# Patient Record
Sex: Male | Born: 1965 | Race: Black or African American | Hispanic: No | Marital: Married | Smoking: Never smoker
Health system: Southern US, Community
[De-identification: ages and names within clinical notes are randomized; demographics above are authoritative.]

## PROBLEM LIST (undated history)

## (undated) DIAGNOSIS — M1990 Unspecified osteoarthritis, unspecified site: Secondary | ICD-10-CM

## (undated) DIAGNOSIS — Z8042 Family history of malignant neoplasm of prostate: Secondary | ICD-10-CM

## (undated) DIAGNOSIS — T7840XA Allergy, unspecified, initial encounter: Secondary | ICD-10-CM

## (undated) DIAGNOSIS — M2550 Pain in unspecified joint: Secondary | ICD-10-CM

## (undated) DIAGNOSIS — Z973 Presence of spectacles and contact lenses: Secondary | ICD-10-CM

## (undated) DIAGNOSIS — E785 Hyperlipidemia, unspecified: Secondary | ICD-10-CM

## (undated) DIAGNOSIS — N529 Male erectile dysfunction, unspecified: Secondary | ICD-10-CM

## (undated) DIAGNOSIS — K219 Gastro-esophageal reflux disease without esophagitis: Secondary | ICD-10-CM

## (undated) DIAGNOSIS — IMO0001 Reserved for inherently not codable concepts without codable children: Secondary | ICD-10-CM

## (undated) DIAGNOSIS — N400 Enlarged prostate without lower urinary tract symptoms: Secondary | ICD-10-CM

## (undated) HISTORY — DX: Reserved for inherently not codable concepts without codable children: IMO0001

## (undated) HISTORY — PX: HAND SURGERY: SHX662

## (undated) HISTORY — DX: Family history of malignant neoplasm of prostate: Z80.42

## (undated) HISTORY — DX: Allergy, unspecified, initial encounter: T78.40XA

## (undated) HISTORY — DX: Unspecified osteoarthritis, unspecified site: M19.90

## (undated) HISTORY — DX: Pain in unspecified joint: M25.50

## (undated) HISTORY — DX: Presence of spectacles and contact lenses: Z97.3

## (undated) HISTORY — DX: Gastro-esophageal reflux disease without esophagitis: K21.9

## (undated) HISTORY — DX: Male erectile dysfunction, unspecified: N52.9

## (undated) HISTORY — DX: Hyperlipidemia, unspecified: E78.5

## (undated) HISTORY — DX: Benign prostatic hyperplasia without lower urinary tract symptoms: N40.0

---

## 2004-06-27 ENCOUNTER — Inpatient Hospital Stay (HOSPITAL_COMMUNITY): Admission: AC | Admit: 2004-06-27 | Discharge: 2004-06-29 | Payer: Self-pay

## 2004-10-24 ENCOUNTER — Emergency Department (HOSPITAL_COMMUNITY): Admission: EM | Admit: 2004-10-24 | Discharge: 2004-10-24 | Payer: Self-pay | Admitting: Family Medicine

## 2004-10-27 ENCOUNTER — Emergency Department (HOSPITAL_COMMUNITY): Admission: EM | Admit: 2004-10-27 | Discharge: 2004-10-27 | Payer: Self-pay | Admitting: Family Medicine

## 2004-10-28 ENCOUNTER — Emergency Department (HOSPITAL_COMMUNITY): Admission: EM | Admit: 2004-10-28 | Discharge: 2004-10-28 | Payer: Self-pay | Admitting: Emergency Medicine

## 2013-08-16 DIAGNOSIS — IMO0001 Reserved for inherently not codable concepts without codable children: Secondary | ICD-10-CM

## 2013-08-16 HISTORY — DX: Reserved for inherently not codable concepts without codable children: IMO0001

## 2013-11-27 ENCOUNTER — Encounter: Payer: Self-pay | Admitting: Medical

## 2013-11-27 ENCOUNTER — Ambulatory Visit (INDEPENDENT_AMBULATORY_CARE_PROVIDER_SITE_OTHER): Payer: 59 | Admitting: Medical

## 2013-11-27 VITALS — BP 120/80 | HR 58 | Temp 98.0°F | Resp 16 | Ht 73.0 in | Wt 221.0 lb

## 2013-11-27 DIAGNOSIS — R350 Frequency of micturition: Secondary | ICD-10-CM

## 2013-11-27 DIAGNOSIS — Z0000 Encounter for general adult medical examination without abnormal findings: Secondary | ICD-10-CM

## 2013-11-27 DIAGNOSIS — Z8639 Personal history of other endocrine, nutritional and metabolic disease: Secondary | ICD-10-CM

## 2013-11-27 DIAGNOSIS — Z862 Personal history of diseases of the blood and blood-forming organs and certain disorders involving the immune mechanism: Secondary | ICD-10-CM

## 2013-11-27 DIAGNOSIS — N529 Male erectile dysfunction, unspecified: Secondary | ICD-10-CM

## 2013-11-27 LAB — LIPID PANEL
Cholesterol: 210 mg/dL — ABNORMAL HIGH (ref 0–200)
HDL: 47 mg/dL (ref >39)
LDL Cholesterol: 150 mg/dL — ABNORMAL HIGH (ref 0–99)
Total CHOL/HDL Ratio: 4.5 Ratio
Triglycerides: 63 mg/dL (ref <150)
VLDL: 13 mg/dL (ref 0–40)

## 2013-11-27 LAB — CBC
HCT: 42.3 % (ref 39.0–52.0)
Hemoglobin: 14.5 g/dL (ref 13.0–17.0)
MCH: 27.6 pg (ref 26.0–34.0)
MCHC: 34.3 g/dL (ref 30.0–36.0)
MCV: 80.6 fL (ref 78.0–100.0)
Platelets: 221 10*3/uL (ref 150–400)
RBC: 5.25 MIL/uL (ref 4.22–5.81)
RDW: 15.1 % (ref 11.5–15.5)
WBC: 4.7 10*3/uL (ref 4.0–10.5)

## 2013-11-27 LAB — COMPREHENSIVE METABOLIC PANEL
ALT: 32 U/L (ref 0–53)
AST: 24 U/L (ref 0–37)
Albumin: 4.3 g/dL (ref 3.5–5.2)
Alkaline Phosphatase: 73 U/L (ref 39–117)
BUN: 11 mg/dL (ref 6–23)
CO2: 26 mEq/L (ref 19–32)
Calcium: 9.4 mg/dL (ref 8.4–10.5)
Chloride: 103 mEq/L (ref 96–112)
Creat: 1.03 mg/dL (ref 0.50–1.35)
Glucose, Bld: 68 mg/dL — ABNORMAL LOW (ref 70–99)
Potassium: 3.9 mEq/L (ref 3.5–5.3)
Sodium: 141 mEq/L (ref 135–145)
Total Bilirubin: 0.6 mg/dL (ref 0.2–1.2)
Total Protein: 6.9 g/dL (ref 6.0–8.3)

## 2013-11-27 LAB — POCT URINALYSIS DIPSTICK
Bilirubin, UA: NEGATIVE
Blood, UA: NEGATIVE
Glucose, UA: NEGATIVE
Ketones, UA: NEGATIVE
Leukocytes, UA: NEGATIVE
Nitrite, UA: NEGATIVE
Protein, UA: NEGATIVE
Spec Grav, UA: 1.015
Urobilinogen, UA: NEGATIVE
pH, UA: 5

## 2013-11-27 NOTE — Patient Instructions (Signed)
Thank you for giving me the opportunity to serve you today.    Your diagnosis today includes: Encounter Diagnoses  Name Primary?  . Routine general medical examination at a health care facility Yes  . Erectile dysfunction   . History of high cholesterol   . Urinary frequency      Specific recommendations today include:  See dentist and eye doctor yearly  We will call with lab results  Exercise regularly, eat healthy  Let me know what you want to do with the sebaceous cyst   Return pending labs.    I have included other useful information below for your review.  Erectile Dysfunction Erectile dysfunction is the inability to get or sustain a good enough erection to have sexual intercourse. Erectile dysfunction may involve:  Inability to get an erection.  Lack of enough hardness to allow penetration.  Loss of the erection before sex is finished.  Premature ejaculation. CAUSES  Certain drugs, such as:  Pain relievers.  Antihistamines.  Antidepressants.  Blood pressure medicines.  Water pills (diuretics).  Ulcer medicines.  Muscle relaxants.  Illegal drugs.  Excessive drinking.  Psychological causes, such as:  Anxiety.  Depression.  Sadness.  Exhaustion.  Performance fear.  Stress.  Physical causes, such as:  Artery problems. This may include diabetes, smoking, liver disease, or atherosclerosis.  High blood pressure.  Hormonal problems, such as low testosterone.  Obesity.  Nerve problems. This may include back or pelvic injuries, diabetes mellitus, multiple sclerosis, or Parkinson disease. SYMPTOMS  Inability to get an erection.  Lack of enough hardness to allow penetration.  Loss of the erection before sex is finished.  Premature ejaculation.  Normal erections at some times, but with frequent unsatisfactory episodes.  Orgasms that are not satisfactory in sensation or frequency.  Low sexual satisfaction in either partner  because of erection problems.  A curved penis occurring with erection. The curve may cause pain or may be too curved to allow for intercourse.  Never having nighttime erections. DIAGNOSIS Your caregiver can often diagnose this condition by:  Performing a physical exam to find other diseases or specific problems with the penis.  Asking you detailed questions about the problem.  Performing blood tests to check for diabetes mellitus or to measure hormone levels.  Performing urine tests to find other underlying health conditions.  Performing an ultrasound exam to check for scarring.  Performing a test to check blood flow to the penis.  Doing a sleep study at home to measure nighttime erections. TREATMENT   You may be prescribed medicines by mouth.  You may be given medicine injections into the penis.  You may be prescribed a vacuum pump with a ring.  Penile implant surgery may be performed. You may receive:  An inflatable implant.  A semirigid implant.  Blood vessel surgery may be performed. HOME CARE INSTRUCTIONS  If you are prescribed oral medicine, you should take the medicine as prescribed. Do not increase the dosage without first discussing it with your physician.  If you are using self-injections, be careful to avoid any veins that are on the surface of the penis. Apply pressure to the injection site for 5 minutes.  If you are using a vacuum pump, make sure you have read the instructions before using it. Discuss any questions with your physician before taking the pump home. SEEK MEDICAL CARE IF:  You experience pain that is not responsive to the pain medicine you have been prescribed.  You experience nausea or vomiting.  SEEK IMMEDIATE MEDICAL CARE IF:   When taking oral or injectable medications, you experience an erection that lasts longer than 4 hours. If your physician is unavailable, go to the nearest emergency room for evaluation. An erection that lasts much  longer than 4 hours can result in permanent damage to your penis.  You have pain that is severe.  You develop redness, severe pain, or severe swelling of your penis.  You have redness spreading up into your groin or lower abdomen.  You are unable to pass your urine. Document Released: 07/30/2000 Document Revised: 04/04/2013 Document Reviewed: 01/04/2013 [facility] Patient Information 2014 [place].
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
## 2013-11-28 LAB — TESTOSTERONE: Testosterone: 251 ng/dL — ABNORMAL LOW (ref 300–890)

## 2013-12-04 ENCOUNTER — Telehealth: Payer: Self-pay | Admitting: Family Medicine

## 2013-12-04 NOTE — Telephone Encounter (Signed)
PATIENT IS AWARE OF HIS APPOINTMENT WITH DR. TILLEY ON 12/10/13 @ 245 PM AND I FAX OVER NOTES TO DR. TILLEY'S OFFICE. CLS

## 2013-12-10 ENCOUNTER — Encounter: Payer: Self-pay | Admitting: Cardiology

## 2013-12-10 DIAGNOSIS — E785 Hyperlipidemia, unspecified: Secondary | ICD-10-CM

## 2013-12-10 DIAGNOSIS — R001 Bradycardia, unspecified: Secondary | ICD-10-CM | POA: Insufficient documentation

## 2013-12-10 NOTE — Progress Notes (Signed)
Patient ID: Dale Good, male   DOB: Aug 26, 1965, 48 y.o.   MRN: [id number]   Antwann, Preziosi    Date of visit:  12/10/2013 DOB:  07-30-1966    Age:  48 yrs. Medical record number: [id number]  Account number:  [id number] Primary Care Provider: Robyne Askew ____________________________ CURRENT DIAGNOSES  1. Arrhythmia-Sinus Bradycardia  2. Hyperlipidemia  3. Other specified cardiac dysrhythmias ____________________________ ALLERGIES  Penicillins, Swelling-throat ____________________________ MEDICATIONS  1. No Meds ____________________________ CHIEF COMPLAINTS  Told low pulse ____________________________ HISTORY OF PRESENT ILLNESS This very nice 48 year old black male is seen for evaluation of bradycardia. He has previously been in good health and has been active in [employer] and been involved in other physical activities. He has mild hyperlipidemia and also recently developed some erectile dysfunction. He denies angina and has no PND, orthopnea, syncope, or claudication. He has no history of palpitations or passing out. He has no prior cardiac issues. He is a nonsmoker. He was noted to have significant sinus bradycardia on a recent physical. ____________________________ PAST HISTORY  Past Medical Illnesses:  hyperlipidemia, erectile dysfunction;  Cardiovascular Illnesses:  no previous history of cardiac disease.;  Surgical Procedures:  R hand surgery;  Cardiology Procedures-Invasive:  no history of prior cardiac procedures;  Cardiology Procedures-Noninvasive:  echocardiogram;  LVEF not documented,   ____________________________ CARDIO-PULMONARY TEST DATES EKG Date:  12/10/2013;   ____________________________ FAMILY HISTORY Brother -- Brother alive with problem, Hypertension, Hypothyroidism, Glaucoma Father -- Father alive with problem, Prostate cancer, Carcinoma of the pancreas, Dementia/Alzheimers Mother -- Mother alive with problem, Hypertension, Renal failure  syndrome, Diabetes mellitus Sister -- Sister alive and well Sister -- Sister alive and well ____________________________ SOCIAL HISTORY Alcohol Use:  rare;  Smoking:  never smoked;  Lifestyle:  married;  Exercise:  walking;  Occupation:  car transporter, Biomedical scientist;  Residence:  lives with wife;   ____________________________ REVIEW OF SYSTEMS General:  denies recent weight change, fatique or change in exercise tolerance.  Integumentary:no rashes or new skin lesions. Eyes: denies diplopia, history of glaucoma or visual problems. Ears, Nose, Throat, Mouth:  denies any hearing loss, epistaxis, hoarseness or difficulty speaking. Respiratory: denies dyspnea, cough, wheezing or hemoptysis. Cardiovascular:  please review HPI Abdominal: history of GERD Genitourinary-Male: erectile dysfunction  Musculoskeletal:  denies arthritis, venous insufficiency, or muscle weakness Neurological:  denies headaches, stroke, or TIA Psychiatric:  denies depession or anxiety  ____________________________ PHYSICAL EXAMINATION VITAL SIGNS  Blood Pressure:  114/70 Sitting, Left arm, large cuff  , 122/80 Standing, Left arm and large cuff   Pulse:  58/min. Weight:  217.00 lbs. Height:  73"BMI: 28  Constitutional:  pleasant African Americian male in no acute distress Skin:  warm and dry to touch, no apparent skin lesions, or masses noted. Head:  normocephalic, normal hair pattern, no masses or tenderness Eyes:  EOMS Intact, PERRLA, C and S clear, Funduscopic exam not done. ENT:  ears, nose and throat reveal no gross abnormalities.  Dentition good. Neck:  supple, without massess. No JVD, thyromegaly or carotid bruits. Carotid upstroke normal. Chest:  normal symmetry, clear to auscultation. Cardiac:  regular rhythm, normal S1 and S2, No S3 or S4, no murmurs, gallops or rubs detected. Abdomen:  abdomen soft,non-tender, no masses, no hepatospenomegaly, or aneurysm noted Peripheral Pulses:  the femoral,dorsalis pedis, and  posterior tibial pulses are full and equal bilaterally with no bruits auscultated. Extremities & Back:  no deformities, clubbing, cyanosis, erythema or edema observed. Normal muscle strength and tone. Neurological:  no gross motor  or sensory deficits noted, affect appropriate, oriented x3. ____________________________ MOST RECENT LIPID PANEL 11/27/13  CHOL TOTL 210 mg/dl, LDL 150 NM, HDL 47 mg/dl, TRIGLYCER 63 mg/dl, CHOL/HDL 4.5 (Calc) ____________________________ IMPRESSIONS/PLAN 1. Sinus bradycardia in an otherwise healthy man 2. Hyperlipidemia 3. Erectile dysfunction  Recommendations:  Obtain exercise treadmill test to be sure he does not have ischemia as he may need to take PGE inhibitors for treatment of erectile dysfunction. I suspect that the sinus bradycardia is due to being in good physical condition. We will check his chronotropic response to exercise at the time of the treadmill. EKG is normal except for sinus bradycardia. ____________________________ TODAYS ORDERS  1. treadmill:  Regular TM First Available  2. 12 Lead EKG: Today                       ____________________________ Cardiology Physician:  Kerry Hough. MD [facility]

## 2013-12-27 ENCOUNTER — Encounter: Payer: Self-pay | Admitting: Family Medicine

## 2013-12-27 ENCOUNTER — Ambulatory Visit: Payer: 59 | Admitting: Medical

## 2014-03-26 ENCOUNTER — Telehealth: Payer: Self-pay | Admitting: Internal Medicine

## 2014-03-26 ENCOUNTER — Encounter: Payer: Self-pay | Admitting: Internal Medicine

## 2014-03-26 NOTE — Telephone Encounter (Signed)
Pt will call and pick up letter tomorrow.

## 2014-03-26 NOTE — Telephone Encounter (Signed)
If agreeable, refer, or at least give the following contacts.  I prefer [facility], but either are fine.   Counseling services  [facility] Rema Fendt, therapist [phone] office [email or web address] [address], [place], [place], [place] [address]   [facility] [address] [place], [place] [address] [phone]   [facility], psychotherapist [address] [place], [place] [address] [phone]   Karin Golden Ph.D., clinical psychologist [phone] office [place], [place] [address] Cognitive Behavior Therapy, Depression, Bipolar, Anxiety, Grief and Loss

## 2014-03-26 NOTE — Telephone Encounter (Signed)
Called pt and asked if he wanted it mail, faxed or if his wife wanted to come pick up it, pt will call back and let me know
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
## 2014-04-09 ENCOUNTER — Ambulatory Visit (INDEPENDENT_AMBULATORY_CARE_PROVIDER_SITE_OTHER): Payer: 59 | Admitting: Psychology

## 2014-04-09 DIAGNOSIS — F4310 Post-traumatic stress disorder, unspecified: Secondary | ICD-10-CM

## 2014-04-23 ENCOUNTER — Ambulatory Visit: Payer: 59 | Admitting: Psychology

## 2015-03-25 DIAGNOSIS — Z880 Allergy status to penicillin: Secondary | ICD-10-CM | POA: Insufficient documentation

## 2015-03-25 DIAGNOSIS — Z8639 Personal history of other endocrine, nutritional and metabolic disease: Secondary | ICD-10-CM | POA: Insufficient documentation

## 2015-03-25 DIAGNOSIS — R109 Unspecified abdominal pain: Secondary | ICD-10-CM | POA: Insufficient documentation

## 2015-03-25 DIAGNOSIS — R197 Diarrhea, unspecified: Secondary | ICD-10-CM | POA: Insufficient documentation

## 2015-03-25 DIAGNOSIS — R112 Nausea with vomiting, unspecified: Secondary | ICD-10-CM | POA: Insufficient documentation

## 2015-03-25 DIAGNOSIS — R51 Headache: Secondary | ICD-10-CM | POA: Diagnosis not present

## 2015-03-26 ENCOUNTER — Telehealth: Payer: Self-pay | Admitting: Medical

## 2015-03-26 ENCOUNTER — Emergency Department (HOSPITAL_COMMUNITY)
Admission: EM | Admit: 2015-03-26 | Discharge: 2015-03-26 | Disposition: A | Discharge status: Home / Self Care | Payer: Managed Care, Other (non HMO) | Attending: Emergency Medicine | Admitting: Emergency Medicine

## 2015-03-26 ENCOUNTER — Encounter (HOSPITAL_COMMUNITY): Payer: Self-pay | Admitting: *Deleted

## 2015-03-26 DIAGNOSIS — R51 Headache: Secondary | ICD-10-CM

## 2015-03-26 DIAGNOSIS — R197 Diarrhea, unspecified: Secondary | ICD-10-CM

## 2015-03-26 DIAGNOSIS — R519 Headache, unspecified: Secondary | ICD-10-CM

## 2015-03-26 DIAGNOSIS — R112 Nausea with vomiting, unspecified: Secondary | ICD-10-CM

## 2015-03-26 LAB — URINALYSIS, ROUTINE W REFLEX MICROSCOPIC
Bilirubin Urine: NEGATIVE
Glucose, UA: NEGATIVE mg/dL
Ketones, ur: NEGATIVE mg/dL
Leukocytes, UA: NEGATIVE
Nitrite: NEGATIVE
Protein, ur: NEGATIVE mg/dL
Specific Gravity, Urine: 1.024 (ref 1.005–1.030)
Urobilinogen, UA: 0.2 mg/dL (ref 0.0–1.0)
pH: 5 (ref 5.0–8.0)

## 2015-03-26 LAB — CBC WITH DIFFERENTIAL/PLATELET
Basophils Absolute: 0 10*3/uL (ref 0.0–0.1)
Basophils Relative: 0 % (ref 0–1)
Eosinophils Absolute: 0 10*3/uL (ref 0.0–0.7)
Eosinophils Relative: 0 % (ref 0–5)
HCT: 45.6 % (ref 39.0–52.0)
Hemoglobin: 15.6 g/dL (ref 13.0–17.0)
Lymphocytes Relative: 11 % — ABNORMAL LOW (ref 12–46)
Lymphs Abs: 0.8 10*3/uL (ref 0.7–4.0)
MCH: 28.4 pg (ref 26.0–34.0)
MCHC: 34.2 g/dL (ref 30.0–36.0)
MCV: 83.1 fL (ref 78.0–100.0)
Monocytes Absolute: 0.3 10*3/uL (ref 0.1–1.0)
Monocytes Relative: 5 % (ref 3–12)
Neutro Abs: 6.2 10*3/uL (ref 1.7–7.7)
Neutrophils Relative %: 84 % — ABNORMAL HIGH (ref 43–77)
Platelets: 202 10*3/uL (ref 150–400)
RBC: 5.49 MIL/uL (ref 4.22–5.81)
RDW: 14 % (ref 11.5–15.5)
WBC: 7.3 10*3/uL (ref 4.0–10.5)

## 2015-03-26 LAB — BASIC METABOLIC PANEL
Anion gap: 11 (ref 5–15)
BUN: 14 mg/dL (ref 6–20)
CO2: 22 mmol/L (ref 22–32)
Calcium: 9 mg/dL (ref 8.9–10.3)
Chloride: 105 mmol/L (ref 101–111)
Creatinine, Ser: 1.15 mg/dL (ref 0.61–1.24)
GFR calc Af Amer: >60 mL/min (ref >60)
GFR calc non Af Amer: >60 mL/min (ref >60)
Glucose, Bld: 114 mg/dL — ABNORMAL HIGH (ref 65–99)
Potassium: 3.7 mmol/L (ref 3.5–5.1)
Sodium: 138 mmol/L (ref 135–145)

## 2015-03-26 LAB — URINE MICROSCOPIC-ADD ON

## 2015-03-26 LAB — LIPASE, BLOOD: Lipase: 19 U/L — ABNORMAL LOW (ref 22–51)

## 2015-03-26 MED ORDER — ONDANSETRON HCL 4 MG PO TABS: 4.0000 mg | ORAL_TABLET | Freq: Four times a day (QID) | ORAL | Status: DC

## 2015-03-26 MED ORDER — SODIUM CHLORIDE 0.9 % IV BOLUS (SEPSIS)
1000.0000 mL | Freq: Once | INTRAVENOUS | Status: AC
  Administered 2015-03-26: 1000 mL via INTRAVENOUS

## 2015-03-26 MED ORDER — METOCLOPRAMIDE HCL 5 MG/ML IJ SOLN
10.0000 mg | Freq: Once | INTRAMUSCULAR | Status: AC
  Administered 2015-03-26: 10 mg via INTRAVENOUS
  Filled 2015-03-26: qty 2

## 2015-03-26 MED ORDER — LOPERAMIDE HCL 2 MG PO CAPS
4.0000 mg | ORAL_CAPSULE | Freq: Once | ORAL | Status: AC
  Administered 2015-03-26: 4 mg via ORAL
  Filled 2015-03-26: qty 2

## 2015-03-26 MED ORDER — DIPHENHYDRAMINE HCL 50 MG/ML IJ SOLN
25.0000 mg | Freq: Once | INTRAMUSCULAR | Status: AC
  Administered 2015-03-26: 25 mg via INTRAVENOUS
  Filled 2015-03-26: qty 1

## 2015-03-26 NOTE — ED Notes (Signed)
Pt stable, ambulatory, states understanding of discharge instructions 
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
## 2015-03-26 NOTE — Telephone Encounter (Signed)
Message was left for pt to call. Needs a emergency room follow up per [facility].

## 2015-03-26 NOTE — Discharge Instructions (Signed)
Take loperamide as needed for diarrhea.  Nausea and Vomiting Nausea is a sick feeling that often comes before throwing up (vomiting). Vomiting is a reflex where stomach contents come out of your mouth. Vomiting can cause severe loss of body fluids (dehydration). Children and elderly adults can become dehydrated quickly, especially if they also have diarrhea. Nausea and vomiting are symptoms of a condition or disease. It is important to find the cause of your symptoms. CAUSES   Direct irritation of the stomach lining. This irritation can result from increased acid production (gastroesophageal reflux disease), infection, food poisoning, taking certain medicines (such as nonsteroidal anti-inflammatory drugs), alcohol use, or tobacco use.  Signals from the brain.These signals could be caused by a headache, heat exposure, an inner ear disturbance, increased pressure in the brain from injury, infection, a tumor, or a concussion, pain, emotional stimulus, or metabolic problems.  An obstruction in the gastrointestinal tract (bowel obstruction).  Illnesses such as diabetes, hepatitis, gallbladder problems, appendicitis, kidney problems, cancer, sepsis, atypical symptoms of a heart attack, or eating disorders.  Medical treatments such as chemotherapy and radiation.  Receiving medicine that makes you sleep (general anesthetic) during surgery. DIAGNOSIS Your caregiver may ask for tests to be done if the problems do not improve after a few days. Tests may also be done if symptoms are severe or if the reason for the nausea and vomiting is not clear. Tests may include:  Urine tests.  Blood tests.  Stool tests.  Cultures (to look for evidence of infection).  X-rays or other imaging studies. Test results can help your caregiver make decisions about treatment or the need for additional tests. TREATMENT You need to stay well hydrated. Drink frequently but in small amounts.You may wish to drink water,  sports drinks, clear broth, or eat frozen ice pops or gelatin dessert to help stay hydrated.When you eat, eating slowly may help prevent nausea.There are also some antinausea medicines that may help prevent nausea. HOME CARE INSTRUCTIONS   Take all medicine as directed by your caregiver.  If you do not have an appetite, do not force yourself to eat. However, you must continue to drink fluids.  If you have an appetite, eat a normal diet unless your caregiver tells you differently.  Eat a variety of complex carbohydrates (rice, wheat, potatoes, bread), lean meats, yogurt, fruits, and vegetables.  Avoid high-fat foods because they are more difficult to digest.  Drink enough water and fluids to keep your urine clear or pale yellow.  If you are dehydrated, ask your caregiver for specific rehydration instructions. Signs of dehydration may include:  Severe thirst.  Dry lips and mouth.  Dizziness.  Dark urine.  Decreasing urine frequency and amount.  Confusion.  Rapid breathing or pulse. SEEK IMMEDIATE MEDICAL CARE IF:   You have blood or brown flecks (like coffee grounds) in your vomit.  You have black or bloody stools.  You have a severe headache or stiff neck.  You are confused.  You have severe abdominal pain.  You have chest pain or trouble breathing.  You do not urinate at least once every 8 hours.  You develop cold or clammy skin.  You continue to vomit for longer than 24 to 48 hours.  You have a fever. MAKE SURE YOU:   Understand these instructions.  Will watch your condition.  Will get help right away if you are not doing well or get worse. Document Released: 08/02/2005 Document Revised: 10/25/2011 Document Reviewed: 12/30/2010 [employer] Patient Information 2015  [employer]. This information is not intended to replace advice given to you by your health care provider. Make sure you discuss any questions you have with your health care  provider.  Diarrhea Diarrhea is frequent loose and watery bowel movements. It can cause you to feel weak and dehydrated. Dehydration can cause you to become tired and thirsty, have a dry mouth, and have decreased urination that often is dark yellow. Diarrhea is a sign of another problem, most often an infection that will not last long. In most cases, diarrhea typically lasts 2-3 days. However, it can last longer if it is a sign of something more serious. It is important to treat your diarrhea as directed by your caregiver to lessen or prevent future episodes of diarrhea. CAUSES  Some common causes include:  Gastrointestinal infections caused by viruses, bacteria, or parasites.  Food poisoning or food allergies.  Certain medicines, such as antibiotics, chemotherapy, and laxatives.  Artificial sweeteners and fructose.  Digestive disorders. HOME CARE INSTRUCTIONS  Ensure adequate fluid intake (hydration): Have 1 cup (8 oz) of fluid for each diarrhea episode. Avoid fluids that contain simple sugars or sports drinks, fruit juices, whole milk products, and sodas. Your urine should be clear or pale yellow if you are drinking enough fluids. Hydrate with an oral rehydration solution that you can purchase at pharmacies, retail stores, and online. You can prepare an oral rehydration solution at home by mixing the following ingredients together:   - tsp table salt.   tsp baking soda.   tsp salt substitute containing potassium chloride.  1  tablespoons sugar.  1 L (34 oz) of water.  Certain foods and beverages may increase the speed at which food moves through the gastrointestinal (GI) tract. These foods and beverages should be avoided and include:  Caffeinated and alcoholic beverages.  High-fiber foods, such as raw fruits and vegetables, nuts, seeds, and whole grain breads and cereals.  Foods and beverages sweetened with sugar alcohols, such as xylitol, sorbitol, and mannitol.  Some foods  may be well tolerated and may help thicken stool including:  Starchy foods, such as rice, toast, pasta, low-sugar cereal, oatmeal, grits, baked potatoes, crackers, and bagels.  Bananas.  Applesauce.  Add probiotic-rich foods to help increase healthy bacteria in the GI tract, such as yogurt and fermented milk products.  Wash your hands well after each diarrhea episode.  Only take over-the-counter or prescription medicines as directed by your caregiver.  Take a warm bath to relieve any burning or pain from frequent diarrhea episodes. SEEK IMMEDIATE MEDICAL CARE IF:   You are unable to keep fluids down.  You have persistent vomiting.  You have blood in your stool, or your stools are black and tarry.  You do not urinate in 6-8 hours, or there is only a small amount of very dark urine.  You have abdominal pain that increases or localizes.  You have weakness, dizziness, confusion, or light-headedness.  You have a severe headache.  Your diarrhea gets worse or does not get better.  You have a fever or persistent symptoms for more than 2-3 days.  You have a fever and your symptoms suddenly get worse. MAKE SURE YOU:   Understand these instructions.  Will watch your condition.  Will get help right away if you are not doing well or get worse. Document Released: 07/23/2002 Document Revised: 12/17/2013 Document Reviewed: 04/09/2012 [facility] Patient Information 2015 [place], [place]. This information is not intended to replace advice given to you by your  health care provider. Make sure you discuss any questions you have with your health care provider.  General Headache Without Cause A headache is pain or discomfort felt around the head or neck area. The specific cause of a headache may not be found. There are many causes and types of headaches. A few common ones are:  Tension headaches.  Migraine headaches.  Cluster headaches.  Chronic daily headaches. HOME CARE INSTRUCTIONS    Keep all follow-up appointments with your caregiver or any specialist referral.  Only take over-the-counter or prescription medicines for pain or discomfort as directed by your caregiver.  Lie down in a dark, quiet room when you have a headache.  Keep a headache journal to find out what may trigger your migraine headaches. For example, write down:  What you eat and drink.  How much sleep you get.  Any change to your diet or medicines.  Try massage or other relaxation techniques.  Put ice packs or heat on the head and neck. Use these 3 to 4 times per day for 15 to 20 minutes each time, or as needed.  Limit stress.  Sit up straight, and do not tense your muscles.  Quit smoking if you smoke.  Limit alcohol use.  Decrease the amount of caffeine you drink, or stop drinking caffeine.  Eat and sleep on a regular schedule.  Get 7 to 9 hours of sleep, or as recommended by your caregiver.  Keep lights dim if bright lights bother you and make your headaches worse. SEEK MEDICAL CARE IF:   You have problems with the medicines you were prescribed.  Your medicines are not working.  You have a change from the usual headache.  You have nausea or vomiting. SEEK IMMEDIATE MEDICAL CARE IF:   Your headache becomes severe.  You have a fever.  You have a stiff neck.  You have loss of vision.  You have muscular weakness or loss of muscle control.  You start losing your balance or have trouble walking.  You feel faint or pass out.  You have severe symptoms that are different from your first symptoms. MAKE SURE YOU:   Understand these instructions.  Will watch your condition.  Will get help right away if you are not doing well or get worse. Document Released: 08/02/2005 Document Revised: 10/25/2011 Document Reviewed: 08/18/2011 [facility] Patient Information 2015 [place], [place]. This information is not intended to replace advice given to you by your health care provider.  Make sure you discuss any questions you have with your health care provider.  Ondansetron tablets What is this medicine? ONDANSETRON (on DAN se tron) is used to treat nausea and vomiting caused by chemotherapy. It is also used to prevent or treat nausea and vomiting after surgery. This medicine may be used for other purposes; ask your health care provider or pharmacist if you have questions. COMMON BRAND NAME(S): Zofran What should I tell my health care provider before I take this medicine? They need to know if you have any of these conditions: -heart disease -history of irregular heartbeat -liver disease -low levels of magnesium or potassium in the blood -an unusual or allergic reaction to ondansetron, granisetron, other medicines, foods, dyes, or preservatives -pregnant or trying to get pregnant -breast-feeding How should I use this medicine? Take this medicine by mouth with a glass of water. Follow the directions on your prescription label. Take your doses at regular intervals. Do not take your medicine more often than directed. Talk to your pediatrician regarding  the use of this medicine in children. Special care may be needed. Overdosage: If you think you have taken too much of this medicine contact a poison control center or emergency room at once. NOTE: This medicine is only for you. Do not share this medicine with others. What if I miss a dose? If you miss a dose, take it as soon as you can. If it is almost time for your next dose, take only that dose. Do not take double or extra doses. What may interact with this medicine? Do not take this medicine with any of the following medications: -apomorphine -certain medicines for fungal infections like fluconazole, itraconazole, ketoconazole, posaconazole, voriconazole -cisapride -dofetilide -dronedarone -pimozide -thioridazine -ziprasidone This medicine may also interact with the following medications: -carbamazepine -certain  medicines for depression, anxiety, or psychotic disturbances -fentanyl -linezolid -MAOIs like Carbex, Eldepryl, Marplan, Nardil, and Parnate -methylene blue (injected into a vein) -other medicines that prolong the QT interval (cause an abnormal heart rhythm) -phenytoin -rifampicin -tramadol This list may not describe all possible interactions. Give your health care provider a list of all the medicines, herbs, non-prescription drugs, or dietary supplements you use. Also tell them if you smoke, drink alcohol, or use illegal drugs. Some items may interact with your medicine. What should I watch for while using this medicine? Check with your doctor or health care professional right away if you have any sign of an allergic reaction. What side effects may I notice from receiving this medicine? Side effects that you should report to your doctor or health care professional as soon as possible: -allergic reactions like skin rash, itching or hives, swelling of the face, lips or tongue -breathing problems -confusion -dizziness -fast or irregular heartbeat -feeling faint or lightheaded, falls -fever and chills -loss of balance or coordination -seizures -sweating -swelling of the hands or feet -tightness in the chest -tremors -unusually weak or tired Side effects that usually do not require medical attention (report to your doctor or health care professional if they continue or are bothersome): -constipation or diarrhea -headache This list may not describe all possible side effects. Call your doctor for medical advice about side effects. You may report side effects to FDA at 1-800-FDA-1088. Where should I keep my medicine? Keep out of the reach of children. Store between 2 and 30 degrees C (36 and 86 degrees F). Throw away any unused medicine after the expiration date. NOTE: This sheet is a summary. It may not cover all possible information. If you have questions about this medicine, talk to  your doctor, pharmacist, or health care provider.  2015, [employer]. (2013-05-09 16:27:45)

## 2015-03-26 NOTE — ED Notes (Signed)
Patient states he started with abd pain, nausea (vomited 1 time) and diarrhea this morning

## 2015-03-27 ENCOUNTER — Telehealth: Payer: Self-pay | Admitting: Family Medicine

## 2015-03-27 NOTE — Telephone Encounter (Signed)
Called pt Dale Good need er follow up

## 2015-06-19 ENCOUNTER — Encounter: Payer: Self-pay | Admitting: Medical

## 2015-06-19 ENCOUNTER — Ambulatory Visit (INDEPENDENT_AMBULATORY_CARE_PROVIDER_SITE_OTHER): Payer: Managed Care, Other (non HMO) | Admitting: Medical

## 2015-06-19 VITALS — BP 110/82 | HR 53 | Ht 73.0 in | Wt 234.0 lb

## 2015-06-19 DIAGNOSIS — Z2820 Immunization not carried out because of patient decision for unspecified reason: Secondary | ICD-10-CM | POA: Diagnosis not present

## 2015-06-19 DIAGNOSIS — N529 Male erectile dysfunction, unspecified: Secondary | ICD-10-CM | POA: Insufficient documentation

## 2015-06-19 DIAGNOSIS — N400 Enlarged prostate without lower urinary tract symptoms: Secondary | ICD-10-CM

## 2015-06-19 DIAGNOSIS — Z8042 Family history of malignant neoplasm of prostate: Secondary | ICD-10-CM

## 2015-06-19 DIAGNOSIS — Z0000 Encounter for general adult medical examination without abnormal findings: Secondary | ICD-10-CM

## 2015-06-19 DIAGNOSIS — N528 Other male erectile dysfunction: Secondary | ICD-10-CM | POA: Diagnosis not present

## 2015-06-19 DIAGNOSIS — Z114 Encounter for screening for human immunodeficiency virus [HIV]: Secondary | ICD-10-CM | POA: Insufficient documentation

## 2015-06-19 DIAGNOSIS — Z125 Encounter for screening for malignant neoplasm of prostate: Secondary | ICD-10-CM | POA: Diagnosis not present

## 2015-06-19 LAB — POCT URINALYSIS DIPSTICK
Bilirubin, UA: NEGATIVE
Blood, UA: NEGATIVE
Glucose, UA: NEGATIVE
Ketones, UA: NEGATIVE
Leukocytes, UA: NEGATIVE
Nitrite, UA: NEGATIVE
Protein, UA: NEGATIVE
Spec Grav, UA: >=1.03
Urobilinogen, UA: NEGATIVE
pH, UA: 5.5

## 2015-06-19 LAB — LIPID PANEL
Cholesterol: 221 mg/dL — ABNORMAL HIGH (ref 125–200)
HDL: 45 mg/dL (ref >=40)
LDL Cholesterol: 149 mg/dL — ABNORMAL HIGH (ref <130)
Total CHOL/HDL Ratio: 4.9 Ratio (ref <=5.0)
Triglycerides: 133 mg/dL (ref <150)
VLDL: 27 mg/dL (ref <30)

## 2015-06-19 LAB — HEMOGLOBIN A1C
Hgb A1c MFr Bld: 5.4 % (ref <5.7)
Mean Plasma Glucose: 108 mg/dL (ref <117)
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
## 2015-06-20 ENCOUNTER — Other Ambulatory Visit: Payer: Self-pay | Admitting: Medical

## 2015-06-20 LAB — PSA: PSA: 5.89 ng/mL — ABNORMAL HIGH (ref <=4.00)

## 2015-06-20 MED ORDER — PRAVASTATIN SODIUM 20 MG PO TABS: 20.0000 mg | ORAL_TABLET | Freq: Every day | ORAL | Status: DC

## 2015-06-20 MED ORDER — CIPROFLOXACIN HCL 500 MG PO TABS: 500.0000 mg | ORAL_TABLET | Freq: Two times a day (BID) | ORAL | Status: DC

## 2015-07-04 ENCOUNTER — Encounter: Payer: Self-pay | Admitting: Medical

## 2015-07-17 ENCOUNTER — Ambulatory Visit: Payer: Managed Care, Other (non HMO) | Admitting: Medical

## 2015-07-24 ENCOUNTER — Ambulatory Visit (INDEPENDENT_AMBULATORY_CARE_PROVIDER_SITE_OTHER): Payer: Managed Care, Other (non HMO) | Admitting: Medical

## 2015-07-24 ENCOUNTER — Encounter: Payer: Self-pay | Admitting: Medical

## 2015-07-24 VITALS — BP 128/70 | HR 56 | Wt 238.0 lb

## 2015-07-24 DIAGNOSIS — E785 Hyperlipidemia, unspecified: Secondary | ICD-10-CM

## 2015-07-24 DIAGNOSIS — R972 Elevated prostate specific antigen [PSA]: Secondary | ICD-10-CM

## 2015-07-24 NOTE — Progress Notes (Signed)
Subjective: Chief Complaint  Patient presents with  . Follow-up    on psa. no questions or concerns.    PSA was elevated recent physical.   Sometimes urine stream isn't as strong as in the past.    In the past has had some urgency, sometimes would have urinary frequency.  Gets up many but not every night to urinate.   In the past has had some urinary hesitancy, dribbling, but not recently.   No other c/o with prostate at this time.   Father diagnosed with prostate cancer in his 23s, was metastatic at that time.  Denies blood in urine or changes in urine or ejaculate.e    hyperlipidemia - doesn't want to use medication, knows he can do better with diet and lifestyle.   Past Medical History  Diagnosis Date  . Wears glasses   . Hyperlipidemia   . Erectile dysfunction   . BPH (benign prostatic hypertrophy)   . Family history of prostate cancer   . Normal cardiac stress test 2015    Dr. Tollie Eth   ROS as in subjective    Objective: BP 128/70 mmHg  Pulse 56  Wt 238 lb (107.956 kg)  Gen: wd, wn, nad DRE: anus normal tone and appearance, prostate mildly enlarged, no nodules, not boggy, occult negative stool  Assessment: Encounter Diagnoses  Name Primary?  . Abnormal PSA Yes  . Hyperlipidemia      Plan: Discussed abnormal PSA, differential diagnosis.   He completed Cipro course in the event of prostatitis.   Labs today, f/u pending lab.     Hyperlipidemia - discussed diet, recommendations .  He declines medication at the moment.

## 2015-07-25 ENCOUNTER — Other Ambulatory Visit: Payer: Self-pay | Admitting: Medical

## 2015-07-25 DIAGNOSIS — R972 Elevated prostate specific antigen [PSA]: Secondary | ICD-10-CM

## 2015-07-25 LAB — PSA, TOTAL AND FREE
PSA, Free Pct: 19 % — ABNORMAL LOW (ref >25)
PSA, Free: 0.81 ng/mL
PSA: 4.17 ng/mL — ABNORMAL HIGH (ref <=4.00)

## 2016-08-16 DIAGNOSIS — R972 Elevated prostate specific antigen [PSA]: Secondary | ICD-10-CM

## 2016-08-16 HISTORY — DX: Elevated prostate specific antigen (PSA): R97.20

## 2017-03-23 ENCOUNTER — Ambulatory Visit (INDEPENDENT_AMBULATORY_CARE_PROVIDER_SITE_OTHER): Payer: Managed Care, Other (non HMO) | Admitting: Medical

## 2017-03-23 ENCOUNTER — Encounter: Payer: Self-pay | Admitting: Medical

## 2017-03-23 VITALS — BP 116/82 | HR 55 | Ht 72.0 in | Wt 218.2 lb

## 2017-03-23 DIAGNOSIS — E785 Hyperlipidemia, unspecified: Secondary | ICD-10-CM

## 2017-03-23 DIAGNOSIS — Z7185 Encounter for immunization safety counseling: Secondary | ICD-10-CM | POA: Insufficient documentation

## 2017-03-23 DIAGNOSIS — Z125 Encounter for screening for malignant neoplasm of prostate: Secondary | ICD-10-CM | POA: Diagnosis not present

## 2017-03-23 DIAGNOSIS — Z1211 Encounter for screening for malignant neoplasm of colon: Secondary | ICD-10-CM

## 2017-03-23 DIAGNOSIS — Z8042 Family history of malignant neoplasm of prostate: Secondary | ICD-10-CM | POA: Diagnosis not present

## 2017-03-23 DIAGNOSIS — N528 Other male erectile dysfunction: Secondary | ICD-10-CM | POA: Diagnosis not present

## 2017-03-23 DIAGNOSIS — Z0000 Encounter for general adult medical examination without abnormal findings: Secondary | ICD-10-CM

## 2017-03-23 DIAGNOSIS — Z1159 Encounter for screening for other viral diseases: Secondary | ICD-10-CM

## 2017-03-23 DIAGNOSIS — Z114 Encounter for screening for human immunodeficiency virus [HIV]: Secondary | ICD-10-CM | POA: Diagnosis not present

## 2017-03-23 DIAGNOSIS — N400 Enlarged prostate without lower urinary tract symptoms: Secondary | ICD-10-CM | POA: Diagnosis not present

## 2017-03-23 DIAGNOSIS — Z2820 Immunization not carried out because of patient decision for unspecified reason: Secondary | ICD-10-CM | POA: Diagnosis not present

## 2017-03-23 DIAGNOSIS — Z7189 Other specified counseling: Secondary | ICD-10-CM | POA: Diagnosis not present

## 2017-03-23 LAB — CBC
HCT: 46.8 % (ref 38.5–50.0)
Hemoglobin: 15.3 g/dL (ref 13.2–17.1)
MCH: 28.2 pg (ref 27.0–33.0)
MCHC: 32.7 g/dL (ref 32.0–36.0)
MCV: 86.2 fL (ref 80.0–100.0)
MPV: 11.8 fL (ref 7.5–12.5)
Platelets: 224 10*3/uL (ref 140–400)
RBC: 5.43 MIL/uL (ref 4.20–5.80)
RDW: 14.9 % (ref 11.0–15.0)
WBC: 4.4 10*3/uL (ref 4.0–10.5)
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
## 2017-03-23 NOTE — Patient Instructions (Signed)
Recommendations:  See your eye doctor yearly for routine vision care.  See your dentist yearly for routine dental care including hygiene visits twice yearly.   We are referring you for first colonoscopy  Vaccines: I recommend you have a shingles vaccine to help prevent shingles or herpes zoster outbreak.   Please call your insurer to inquire about coverage for the Shingrix vaccine given in 2 doses.   Some insurers cover this vaccine after age 51, some cover this after age 63.  If your insurer covers this, then call to schedule appointment to have this vaccine here.  I recommend you return for a Tdap vaccine  Diet recommendations:  I recommend they exercise regularly such as 30 minute or more 5+ days per week  I recommend they eat 3-4 fruit servings daily, vegetables every meal, and get whole grains in the diet such as 1 cup serving of brown rice or whole grain pasta, or barley, or other whole grain, or 2 slices of whole grain bread at a meal  I recommend they get health fats in the diet such as almonds, avocado, fish, and olive oil for example  I recommend they cut back on meat and dairy.  We don't need meat every meal or even every day.   Meat is general is high in calories and cholesterol  I recommend they significantly limit animal fat, sweets and baked goods such as cookies, chips, donuts, cakes, and pies   Limit butter, margarine, ice cream, and other unhealthy snack foods    Dentist recommendation: Dr. Jonna Coup, dentist [address] [place], [place] [address] [phone] [email or web address]

## 2017-03-24 ENCOUNTER — Telehealth: Payer: Self-pay

## 2017-03-24 ENCOUNTER — Other Ambulatory Visit: Payer: Self-pay | Admitting: Medical

## 2017-03-24 LAB — COMPREHENSIVE METABOLIC PANEL
ALT: 39 U/L (ref 9–46)
AST: 31 U/L (ref 10–35)
Albumin: 4.6 g/dL (ref 3.6–5.1)
Alkaline Phosphatase: 80 U/L (ref 40–115)
BUN: 19 mg/dL (ref 7–25)
CO2: 21 mmol/L (ref 20–32)
Calcium: 9.8 mg/dL (ref 8.6–10.3)
Chloride: 103 mmol/L (ref 98–110)
Creat: 1.2 mg/dL (ref 0.70–1.33)
Glucose, Bld: 95 mg/dL (ref 65–99)
Potassium: 4.5 mmol/L (ref 3.5–5.3)
Sodium: 139 mmol/L (ref 135–146)
Total Bilirubin: 0.5 mg/dL (ref 0.2–1.2)
Total Protein: 7.4 g/dL (ref 6.1–8.1)

## 2017-03-24 LAB — PSA, TOTAL AND FREE
PSA, % Free: 16 % — ABNORMAL LOW (ref >25)
PSA, Free: 1.2 ng/mL
PSA, Total: 7.6 ng/mL — ABNORMAL HIGH (ref <=4.0)

## 2017-03-24 LAB — HEMOGLOBIN A1C
Hgb A1c MFr Bld: 5.5 % (ref <5.7)
Mean Plasma Glucose: 111 mg/dL

## 2017-03-24 LAB — LIPID PANEL
Cholesterol: 219 mg/dL — ABNORMAL HIGH (ref <200)
HDL: 56 mg/dL (ref >40)
LDL Cholesterol: 151 mg/dL — ABNORMAL HIGH (ref <100)
Total CHOL/HDL Ratio: 3.9 Ratio (ref <5.0)
Triglycerides: 62 mg/dL (ref <150)
VLDL: 12 mg/dL (ref <30)

## 2017-03-24 LAB — HEPATITIS C ANTIBODY: HCV Ab: NONREACTIVE

## 2017-03-24 LAB — HIV ANTIBODY (ROUTINE TESTING W REFLEX): HIV: NONREACTIVE

## 2017-03-24 MED ORDER — PRAVASTATIN SODIUM 20 MG PO TABS: 20.0000 mg | ORAL_TABLET | Freq: Every day | ORAL | 1 refills | Status: DC

## 2017-03-24 NOTE — Telephone Encounter (Signed)
Records from Dr. Wynonia Lawman office placed in your folder. Pt did not have a stress test with his office. Dale Good

## 2017-03-29 ENCOUNTER — Other Ambulatory Visit: Payer: Self-pay

## 2017-03-29 DIAGNOSIS — R972 Elevated prostate specific antigen [PSA]: Secondary | ICD-10-CM

## 2017-04-25 ENCOUNTER — Encounter: Payer: Self-pay | Admitting: Internal Medicine

## 2017-05-23 ENCOUNTER — Encounter: Payer: Self-pay | Admitting: Internal Medicine

## 2017-05-24 ENCOUNTER — Telehealth: Payer: Self-pay | Admitting: Family Medicine

## 2017-05-24 NOTE — Telephone Encounter (Signed)
[facility] called and request labs and ov for pt referral.  Faxed same to [phone]

## 2017-07-29 ENCOUNTER — Encounter: Payer: Self-pay | Admitting: Internal Medicine

## 2017-09-20 ENCOUNTER — Other Ambulatory Visit: Payer: Self-pay | Admitting: Medical

## 2017-09-20 ENCOUNTER — Encounter: Payer: Self-pay | Admitting: Medical

## 2018-04-20 ENCOUNTER — Encounter: Payer: Self-pay | Admitting: Medical

## 2018-04-20 ENCOUNTER — Encounter: Payer: Self-pay | Admitting: Gastroenterology

## 2018-04-20 ENCOUNTER — Ambulatory Visit (INDEPENDENT_AMBULATORY_CARE_PROVIDER_SITE_OTHER): Payer: Managed Care, Other (non HMO) | Admitting: Medical

## 2018-04-20 VITALS — BP 120/70 | HR 48 | Temp 97.9°F | Resp 16 | Ht 73.5 in | Wt 230.8 lb

## 2018-04-20 DIAGNOSIS — Z7189 Other specified counseling: Secondary | ICD-10-CM

## 2018-04-20 DIAGNOSIS — R972 Elevated prostate specific antigen [PSA]: Secondary | ICD-10-CM

## 2018-04-20 DIAGNOSIS — Z0000 Encounter for general adult medical examination without abnormal findings: Secondary | ICD-10-CM | POA: Diagnosis not present

## 2018-04-20 DIAGNOSIS — Z1211 Encounter for screening for malignant neoplasm of colon: Secondary | ICD-10-CM

## 2018-04-20 DIAGNOSIS — M25571 Pain in right ankle and joints of right foot: Secondary | ICD-10-CM

## 2018-04-20 DIAGNOSIS — E785 Hyperlipidemia, unspecified: Secondary | ICD-10-CM | POA: Diagnosis not present

## 2018-04-20 DIAGNOSIS — Z23 Encounter for immunization: Secondary | ICD-10-CM

## 2018-04-20 DIAGNOSIS — Z7185 Encounter for immunization safety counseling: Secondary | ICD-10-CM

## 2018-04-20 DIAGNOSIS — M25572 Pain in left ankle and joints of left foot: Secondary | ICD-10-CM

## 2018-04-20 LAB — POCT URINALYSIS DIP (PROADVANTAGE DEVICE)
BILIRUBIN UA: NEGATIVE
Blood, UA: NEGATIVE
GLUCOSE UA: NEGATIVE mg/dL
Ketones, POC UA: NEGATIVE mg/dL
Leukocytes, UA: NEGATIVE
Nitrite, UA: NEGATIVE
Protein Ur, POC: NEGATIVE mg/dL
Specific Gravity, Urine: 1.03
Urobilinogen, Ur: NEGATIVE
pH, UA: 6 (ref 5.0–8.0)
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
## 2018-04-21 LAB — COMPREHENSIVE METABOLIC PANEL
ALBUMIN: 4.6 g/dL (ref 3.5–5.5)
ALK PHOS: 75 IU/L (ref 39–117)
ALT: 40 IU/L (ref 0–44)
AST: 28 IU/L (ref 0–40)
Albumin/Globulin Ratio: 1.6 (ref 1.2–2.2)
BILIRUBIN TOTAL: 0.3 mg/dL (ref 0.0–1.2)
BUN / CREAT RATIO: 12 (ref 9–20)
BUN: 14 mg/dL (ref 6–24)
CHLORIDE: 101 mmol/L (ref 96–106)
CO2: 24 mmol/L (ref 20–29)
CREATININE: 1.14 mg/dL (ref 0.76–1.27)
Calcium: 9.7 mg/dL (ref 8.7–10.2)
GFR calc non Af Amer: 74 mL/min/{1.73_m2} (ref >59)
GFR, EST AFRICAN AMERICAN: 85 mL/min/{1.73_m2} (ref >59)
GLOBULIN, TOTAL: 2.8 g/dL (ref 1.5–4.5)
Glucose: 96 mg/dL (ref 65–99)
Potassium: 4 mmol/L (ref 3.5–5.2)
SODIUM: 142 mmol/L (ref 134–144)
TOTAL PROTEIN: 7.4 g/dL (ref 6.0–8.5)

## 2018-04-21 LAB — CBC WITH DIFFERENTIAL/PLATELET
Basophils Absolute: 0 10*3/uL (ref 0.0–0.2)
Basos: 0 %
EOS (ABSOLUTE): 0.1 10*3/uL (ref 0.0–0.4)
EOS: 2 %
Hematocrit: 47.8 % (ref 37.5–51.0)
Hemoglobin: 15.3 g/dL (ref 13.0–17.7)
Immature Grans (Abs): 0 10*3/uL (ref 0.0–0.1)
Immature Granulocytes: 0 %
LYMPHS ABS: 2.3 10*3/uL (ref 0.7–3.1)
Lymphs: 43 %
MCH: 27.2 pg (ref 26.6–33.0)
MCHC: 32 g/dL (ref 31.5–35.7)
MCV: 85 fL (ref 79–97)
MONOS ABS: 0.6 10*3/uL (ref 0.1–0.9)
Monocytes: 12 %
NEUTROS ABS: 2.3 10*3/uL (ref 1.4–7.0)
Neutrophils: 43 %
Platelets: 212 10*3/uL (ref 150–450)
RBC: 5.63 x10E6/uL (ref 4.14–5.80)
RDW: 14.1 % (ref 12.3–15.4)
WBC: 5.3 10*3/uL (ref 3.4–10.8)

## 2018-04-21 LAB — PSA: PROSTATE SPECIFIC AG, SERUM: 7 ng/mL — AB (ref 0.0–4.0)

## 2018-04-21 LAB — HEMOGLOBIN A1C
Est. average glucose Bld gHb Est-mCnc: 114 mg/dL
Hgb A1c MFr Bld: 5.6 % (ref 4.8–5.6)

## 2018-04-21 LAB — LIPID PANEL
CHOL/HDL RATIO: 5 ratio (ref 0.0–5.0)
Cholesterol, Total: 228 mg/dL — ABNORMAL HIGH (ref 100–199)
HDL: 46 mg/dL (ref >39)
LDL CALC: 162 mg/dL — AB (ref 0–99)
Triglycerides: 101 mg/dL (ref 0–149)
VLDL CHOLESTEROL CAL: 20 mg/dL (ref 5–40)

## 2018-04-24 ENCOUNTER — Other Ambulatory Visit: Payer: Self-pay | Admitting: Medical

## 2018-04-24 DIAGNOSIS — M79671 Pain in right foot: Secondary | ICD-10-CM

## 2018-04-24 DIAGNOSIS — M79672 Pain in left foot: Principal | ICD-10-CM

## 2018-04-24 MED ORDER — PRAVASTATIN SODIUM 20 MG PO TABS: 20.0000 mg | ORAL_TABLET | Freq: Every evening | ORAL | 2 refills | Status: DC

## 2018-04-25 LAB — URIC ACID: URIC ACID: 8.3 mg/dL (ref 3.7–8.6)

## 2018-05-16 HISTORY — PX: COLONOSCOPY: SHX174

## 2018-05-23 ENCOUNTER — Telehealth: Payer: Self-pay | Admitting: Medical

## 2018-05-23 NOTE — Telephone Encounter (Signed)
See patient e-mail.  Please call [facility] to confirm we have referred officially to urology per insurance requirement.  Then call patient and let him call and make appt time that meets his schedule

## 2018-05-24 NOTE — Telephone Encounter (Signed)
Notes and referral form faxed 05-24-18.

## 2018-05-25 ENCOUNTER — Ambulatory Visit (AMBULATORY_SURGERY_CENTER): Payer: Self-pay

## 2018-05-25 VITALS — Ht 73.0 in | Wt 232.2 lb

## 2018-05-25 DIAGNOSIS — Z1211 Encounter for screening for malignant neoplasm of colon: Secondary | ICD-10-CM

## 2018-05-25 MED ORDER — PEG-KCL-NACL-NASULF-NA ASC-C 140 G PO SOLR: 1.0000 | Freq: Once | ORAL | 0 refills | Status: AC

## 2018-05-25 NOTE — Progress Notes (Signed)
Denies allergies to eggs or soy products. Denies complication of anesthesia or sedation. Denies use of weight loss medication. Denies use of O2.   Emmi instructions declined.    A pay no more than 50.00 coupon was given to the patient.  

## 2018-05-26 ENCOUNTER — Encounter: Payer: Self-pay | Admitting: Gastroenterology

## 2018-05-26 ENCOUNTER — Telehealth: Payer: Self-pay

## 2018-05-26 NOTE — Telephone Encounter (Signed)
Patient has appointment on 06-16-18 at 38 with Dr Gloriann Loan at [facility].

## 2018-06-07 ENCOUNTER — Ambulatory Visit (AMBULATORY_SURGERY_CENTER): Payer: Managed Care, Other (non HMO) | Admitting: Gastroenterology

## 2018-06-07 ENCOUNTER — Encounter: Payer: Self-pay | Admitting: Gastroenterology

## 2018-06-07 VITALS — BP 118/76 | HR 49 | Temp 97.5°F | Resp 25 | Ht 73.0 in | Wt 232.0 lb

## 2018-06-07 DIAGNOSIS — D125 Benign neoplasm of sigmoid colon: Secondary | ICD-10-CM

## 2018-06-07 DIAGNOSIS — Z1211 Encounter for screening for malignant neoplasm of colon: Secondary | ICD-10-CM

## 2018-06-07 MED ORDER — SODIUM CHLORIDE 0.9 % IV SOLN: 500.0000 mL | Freq: Once | INTRAVENOUS | Status: DC

## 2018-06-07 NOTE — Patient Instructions (Signed)
YOU HAD AN ENDOSCOPIC PROCEDURE TODAY AT THE [facility]:   Refer to the procedure report that was given to you for any specific questions about what was found during the examination.  If the procedure report does not answer your questions, please call your gastroenterologist to clarify.  If you requested that your care partner not be given the details of your procedure findings, then the procedure report has been included in a sealed envelope for you to review at your convenience later.  YOU SHOULD EXPECT: Some feelings of bloating in the abdomen. Passage of more gas than usual.  Walking can help get rid of the air that was put into your GI tract during the procedure and reduce the bloating. If you had a lower endoscopy (such as a colonoscopy or flexible sigmoidoscopy) you may notice spotting of blood in your stool or on the toilet paper. If you underwent a bowel prep for your procedure, you may not have a normal bowel movement for a few days.  Please Note:  You might notice some irritation and congestion in your nose or some drainage.  This is from the oxygen used during your procedure.  There is no need for concern and it should clear up in a day or so.  SYMPTOMS TO REPORT IMMEDIATELY:   Following lower endoscopy (colonoscopy or flexible sigmoidoscopy):  Excessive amounts of blood in the stool  Significant tenderness or worsening of abdominal pains  Swelling of the abdomen that is new, acute  Fever of 100F or higher  For urgent or emergent issues, a gastroenterologist can be reached at any hour by calling [phone].   DIET:  We do recommend a small meal at first, but then you may proceed to your regular diet.  Drink plenty of fluids but you should avoid alcoholic beverages for 24 hours.  ACTIVITY:  You should plan to take it easy for the rest of today and you should NOT DRIVE or use heavy machinery until tomorrow (because of the sedation medicines used during the test).     FOLLOW UP: Our staff will call the number listed on your records the next business day following your procedure to check on you and address any questions or concerns that you may have regarding the information given to you following your procedure. If we do not reach you, we will leave a message.  However, if you are feeling well and you are not experiencing any problems, there is no need to return our call.  We will assume that you have returned to your regular daily activities without incident.  If any biopsies were taken you will be contacted by phone or by letter within the next 1-3 weeks.  Please call us at [phone] if you have not heard about the biopsies in 3 weeks.    SIGNATURES/CONFIDENTIALITY: You and/or your care partner have signed paperwork which will be entered into your electronic medical record.  These signatures attest to the fact that that the information above on your After Visit Summary has been reviewed and is understood.  Full responsibility of the confidentiality of this discharge information lies with you and/or your care-partner. 

## 2018-06-07 NOTE — Progress Notes (Signed)
Pt's states no medical or surgical changes since previsit or office visit. 

## 2018-06-07 NOTE — Op Note (Addendum)
[place] Patient Name: Dale Good Procedure Date: 06/07/2018 10:56 AM MRN: [id number] Endoscopist: [place]. Loletha Carrow , MD Age: 52 Referring MD:  Date of Birth: 1966-03-17 Gender: Male Account #: [id number] Procedure:                Colonoscopy Indications:              Screening for colorectal malignant neoplasm, This                            is the patient's first colonoscopy Medicines:                Monitored Anesthesia Care Procedure:                Pre-Anesthesia Assessment:                           - Prior to the procedure, a History and Physical                            was performed, and patient medications and                            allergies were reviewed. The patient's tolerance of                            previous anesthesia was also reviewed. The risks                            and benefits of the procedure and the sedation                            options and risks were discussed with the patient.                            All questions were answered, and informed consent                            was obtained. Prior Anticoagulants: The patient has                            taken no previous anticoagulant or antiplatelet                            agents. ASA Grade Assessment: II - A patient with                            mild systemic disease. After reviewing the risks                            and benefits, the patient was deemed in                            satisfactory condition to undergo the procedure.  After obtaining informed consent, the colonoscope                            was passed under direct vision. Throughout the                            procedure, the patient's blood pressure, pulse, and                            oxygen saturations were monitored continuously. The                            Colonoscope was introduced through the anus and                            advanced to the the  cecum, identified by                            appendiceal orifice and ileocecal valve. The                            colonoscopy was performed without difficulty. The                            patient tolerated the procedure well. The quality                            of the bowel preparation was excellent. The                            ileocecal valve, appendiceal orifice, and rectum                            were photographed. The quality of the bowel                            preparation was evaluated using the BBPS [facility]                            Bowel Preparation Scale) with scores of: Right                            Colon = 3, Transverse Colon = 3 and Left Colon = 3                            (entire mucosa seen well with no residual staining,                            small fragments of stool or opaque liquid). The                            total BBPS score equals 9. Scope In: 60:73:71 AM Scope Out: 11:20:54 AM Scope Withdrawal Time: 0 hours 10  minutes 36 seconds  Total Procedure Duration: 0 hours 12 minutes 36 seconds  Findings:                 The perianal and digital rectal examinations were                            normal.                           A 5 mm polyp was found in the distal sigmoid colon.                            The polyp was sessile. The polyp was removed with a                            cold snare. Resection and retrieval were complete.                           The exam was otherwise without abnormality on                            direct and retroflexion views. Complications:            No immediate complications. Estimated Blood Loss:     Estimated blood loss was minimal. Impression:               - One 5 mm polyp in the distal sigmoid colon,                            removed with a cold snare. Resected and retrieved.                           - The examination was otherwise normal on direct                            and retroflexion  views. Recommendation:           - Patient has a contact number available for                            emergencies. The signs and symptoms of potential                            delayed complications were discussed with the                            patient. Return to normal activities tomorrow.                            Written discharge instructions were provided to the                            patient.                           - Resume previous  diet.                           - Continue present medications.                           - Await pathology results.                           - Repeat colonoscopy is recommended for                            surveillance. The colonoscopy date will be                            determined after pathology results from today's                            exam become available for review. Melisha Eggleton L. Loletha Carrow, MD 06/07/2018 11:27:03 AM This report has been signed electronically.

## 2018-06-07 NOTE — Progress Notes (Signed)
Report given to PACU, vss 

## 2018-06-07 NOTE — Progress Notes (Signed)
Called to room to assist during endoscopic procedure.  Patient ID and intended procedure confirmed with present staff. Received instructions for my participation in the procedure from the performing physician.  

## 2018-06-08 ENCOUNTER — Telehealth: Payer: Self-pay

## 2018-06-08 NOTE — Telephone Encounter (Signed)
  Follow up Call-  Call back number 06/07/2018  Post procedure Call Back phone  # [phone]  Permission to leave phone message Yes  Some recent data might be hidden     Patient questions:  Do you have a fever, pain , or abdominal swelling? No. Pain Score  0 *  Have you tolerated food without any problems? Yes.    Have you been able to return to your normal activities? Yes.    Do you have any questions about your discharge instructions: Diet   No. Medications  No. Follow up visit  No.  Do you have questions or concerns about your Care? No.  Actions: * If pain score is 4 or above: No action needed, pain <4.

## 2018-06-13 ENCOUNTER — Encounter: Payer: Self-pay | Admitting: Gastroenterology

## 2018-07-01 ENCOUNTER — Other Ambulatory Visit: Payer: Self-pay | Admitting: Medical

## 2018-07-06 ENCOUNTER — Telehealth: Payer: Self-pay | Admitting: Family Medicine

## 2018-07-06 NOTE — Telephone Encounter (Signed)
Shane notates that pt need fasting ov.  Called pt reached voice mail [email or web address].

## 2018-08-16 DIAGNOSIS — E790 Hyperuricemia without signs of inflammatory arthritis and tophaceous disease: Secondary | ICD-10-CM

## 2018-08-16 HISTORY — DX: Hyperuricemia without signs of inflammatory arthritis and tophaceous disease: E79.0

## 2018-09-13 ENCOUNTER — Encounter: Payer: Self-pay | Admitting: Nurse Practitioner

## 2018-09-13 ENCOUNTER — Ambulatory Visit: Payer: Self-pay | Admitting: Nurse Practitioner

## 2018-09-13 VITALS — BP 144/82 | HR 75 | Temp 98.6°F | Resp 14 | Wt 235.6 lb

## 2018-09-13 DIAGNOSIS — H109 Unspecified conjunctivitis: Secondary | ICD-10-CM

## 2018-09-13 MED ORDER — POLYMYXIN B-TRIMETHOPRIM 10000-0.1 UNIT/ML-% OP SOLN: 2.0000 [drp] | Freq: Four times a day (QID) | OPHTHALMIC | 0 refills | Status: AC

## 2018-09-13 NOTE — Patient Instructions (Addendum)
Bacterial Conjunctivitis, Adult -Use medication as prescribed. -We have diagnosed you with a bacterial conjunctivitis.  This condition is highly contagious.  Strict hand hygiene is essential in preventing spread of this condition. -We will go ahead and treat both eyes as you most likely may develop the same condition in the left eye. -Cool compresses to the affected eye as needed for discomfort. -Ibuprofen or Tylenol for pain, fever, or general discomfort. -I would like you to follow-up with your PCP for evaluation of your blood pressure.  In the interim, if you develop blurred vision, headache, dizziness, lower extremity swelling, chest pain or palpitations, I would like you to follow-up in the emergency department immediately. -Follow-up in the emergency department if you have loss of vision, worsening drainage, or any change in your vision.  Bacterial conjunctivitis is an infection of the clear membrane that covers the white part of your eye and the inner surface of your eyelid (conjunctiva). When the blood vessels in your conjunctiva become inflamed, your eye becomes red or pink, and it will probably feel itchy. Bacterial conjunctivitis spreads very easily from person to person (is contagious). It also spreads easily from one eye to the other eye. What are the causes? This condition is caused by bacteria. You may get the infection if you come into close contact with:  A person who is infected with the bacteria.  Items that are contaminated with the bacteria, such as a face towel, contact lens solution, or eye makeup. What increases the risk? You are more likely to develop this condition if you:  Are exposed to other people who have the infection.  Wear contact lenses.  Have a sinus infection.  Have had a recent eye injury or surgery.  Have a weak body defense system (immune system).  Have a medical condition that causes dry eyes. What are the signs or symptoms? Symptoms of this  condition include:  Thick, yellowish discharge from the eye. This may turn into a crust on the eyelid overnight and cause your eyelids to stick together.  Tearing or watery eyes.  Itchy eyes.  Burning feeling in your eyes.  Eye redness.  Swollen eyelids.  Blurred vision. How is this diagnosed? This condition is diagnosed based on your symptoms and medical history. Your health care provider may also take a sample of discharge from your eye to find the cause of your infection. This is rarely done. How is this treated? This condition may be treated with:  Antibiotic eye drops or ointment to clear the infection more quickly and prevent the spread of infection to others.  Oral antibiotic medicines to treat infections that do not respond to drops or ointments or that last longer than 10 days.  Cool, wet cloths (cool compresses) placed on the eyes.  Artificial tears applied 2-6 times a day. Follow these instructions at home: Medicines  Take or apply your antibiotic medicine as told by your health care provider. Do not stop taking or applying the antibiotic even if you start to feel better.  Take or apply over-the-counter and prescription medicines only as told by your health care provider.  Be very careful to avoid touching the edge of your eyelid with the eye-drop bottle or the ointment tube when you apply medicines to the affected eye. This will keep you from spreading the infection to your other eye or to other people. Managing discomfort  Gently wipe away any drainage from your eye with a warm, wet washcloth or a cotton ball.  Apply a clean, cool compress to your eye for 10-20 minutes, 3-4 times a day. General instructions  Do not wear contact lenses until the inflammation is gone and your health care provider says it is safe to wear them again. Ask your health care provider how to sterilize or replace your contact lenses before you use them again. Wear glasses until you can  resume wearing contact lenses.  Avoid wearing eye makeup until the inflammation is gone. Throw away any old eye cosmetics that may be contaminated.  Change or wash your pillowcase every day.  Do not share towels or washcloths. This may spread the infection.  Wash your hands often with soap and water. Use paper towels to dry your hands.  Avoid touching or rubbing your eyes.  Do not drive or use heavy machinery if your vision is blurred. Contact a health care provider if:  You have a fever.  Your symptoms do not get better after 10 days. Get help right away if you have:  A fever and your symptoms suddenly get worse.  Severe pain when you move your eye.  Facial pain, redness, or swelling.  Sudden loss of vision. Summary  Bacterial conjunctivitis is an infection of the clear membrane that covers the white part of your eye and the inner surface of your eyelid (conjunctiva).  Bacterial conjunctivitis spreads very easily from person to person (is contagious).  Wash your hands often with soap and water. Use paper towels to dry your hands.  Take or apply your antibiotic medicine as told by your health care provider. Do not stop taking or applying the antibiotic even if you start to feel better.  Contact a health care provider if you have a fever or your symptoms do not get better after 10 days. This information is not intended to replace advice given to you by your health care provider. Make sure you discuss any questions you have with your health care provider. Document Released: 08/02/2005 Document Revised: 03/08/2018 Document Reviewed: 03/08/2018 Elsevier Interactive Patient Education  2019 [employer].  How to Use Eye Drops and Eye Ointments Your health care provider may prescribe or recommend eye drops or ointments for a variety of reasons, such as to help relieve symptoms like redness, dryness, and itchiness. You may also need to use eye drops or ointments to treat an eye  infection or before or after surgery on your eye. You should use eye drops and ointments only as told by your health care provider. You may need to have a caregiver or family member help you place eye drops or ointment in your eye. How to use eye drops Follow these steps when putting eye drops in your eye: 1. Wash your hands with soap and water. 2. Follow any instructions for mixing or shaking eye drops prior to using them. 3. Stand in front of a mirror so that you can see your eye well. 4. Place one finger under your eye and use it to gently pull your lower lid downward. This forms a small pocket to place the drop. Keep that finger in place. 5. Using your other hand, hold the dropper between your thumb and index finger. 6. Position the dropper just above the edge of your lower eyelid. Do not touch the dropper to your lid or your eyeball. 7. Steady your hand. One way to do this is to lean your index finger against your brow. 8. Look up slightly. 9. Slowly and gently squeeze one drop of medicine into  your eye near the lower lid. 10. Gently close your eye. 11. Place a finger between your lower eyelid and your nose. Press gently for 2 minutes. This increases the amount of time that the medicine is exposed to the eye and can help prevent certain side effects. 12. Do not rub your eye. How to apply eye ointments Follow these steps when applying eye ointments: 1. Wash your hands with soap and water. 2. Stand in front of a mirror so that you can see your eye well. 3. Place one finger under your eye and use it to gently pull your lower lid downward. Keep that finger in place. 4. Using your other hand, hold the tip of the tube between your thumb and index finger. Brace your other fingers against your cheek or nose. 5. Hold the tube just above the edge of your lower eyelid. Do not touch the tube to your lid or your eyeball. 6. Line the inner part of your lower lid with ointment. 7. Let go of your lower  lid. 8. Gently pull up on your upper lid and look down. This will spread the ointment over the surface of your eye. 9. Let go of the upper lid. 10. Gently close your eyes. If you can, leave them closed for 1-2 minutes. 11. Do not rub your eyes. If you applied the ointment correctly, your vision will be blurry for a few minutes. This is normal. General tips  Make sure you use the eye drops or ointment only as told by your health care provider.  Ask for help from a caregiver or family member if you are unable to apply the eye drops or ointment.  If you have been told to use both eye drops and an eye ointment, apply the eye drops first, then wait 3-4 minutes before you apply the ointment.  Try not to touch the tip of the dropper or tube to your eye. A dropper or tube that has touched the eye can get germs on it (get contaminated). Summary  Your health care provider may prescribe or recommend eye drops or ointments to relieve symptoms like redness, dryness, and itchiness.  Be sure to wash your hands with soap and water before applying eye drops or ointment.  You may need to have a caregiver or family member help you place eye drops or ointment in your eye.  Make sure you use the eye drops or ointment only as told by your health care provider. This information is not intended to replace advice given to you by your health care provider. Make sure you discuss any questions you have with your health care provider. Document Released: 11/08/2000 Document Revised: 08/04/2017 Document Reviewed: 08/04/2017 Elsevier Interactive Patient Education  2019 [employer].

## 2018-09-13 NOTE — Progress Notes (Addendum)
History of Present Illness   Patient Identification Dale Good is a 53 y.o. male.  Patient information was obtained from patient. History/Exam limitations: none.   Chief Complaint  right eye irritation (1 week (eye drops))   Patient presents for evaluation of redness, foreign body sensation and itchiness to the right eye . Onset of symptoms was gradual starting 1 week ago, with gradually worsening since that time. There is a discharge present. There is not a history trauma to the eye. There is not a history of foreign body getting into eye. The patient is not a contact lens wearer.  The patient informs his symptoms started about 7 days ago with watery eyes and itching.  Patient states since that time his symptoms have progressed to increased drainage, continuous drainage, and muco-purulent drainage that started today.  The patient denies any foreign object to the right eye, change in vision, or blurry vision.  Patient denies use of contact lenses but does wear eyeglasses.  Patient has been using over-the-counter eyedrops for his symptoms.  Patient thinks he may have been exposed by his grandson who was recently diagnosed with pink eye.  Past Medical History:  Diagnosis Date  . Allergy   . Arthritis   . BPH (benign prostatic hypertrophy)   . Erectile dysfunction   . Family history of prostate cancer   . GERD (gastroesophageal reflux disease)   . Hyperlipidemia   . Normal cardiac stress test 2015   Dr. Tollie Eth  . Wears glasses    Family History  Problem Relation Age of Onset  . Hypertension Mother   . Kidney disease Mother        dialysis  . Diabetes Mother        amputation  . Hypertension Brother   . Glaucoma Brother   . Cancer Father 76       prostate  . Heart disease Neg Hx   . Stroke Neg Hx   . Colon cancer Neg Hx   . Esophageal cancer Neg Hx   . Rectal cancer Neg Hx   . Stomach cancer Neg Hx    Current Outpatient Medications  Medication Sig Dispense  Refill  . Multiple Vitamin (MULTIVITAMIN) tablet Take 1 tablet by mouth daily.    Marland Kitchen OVER THE COUNTER MEDICATION Fish Oil 1000 mg. One capsule daily.    . pravastatin (PRAVACHOL) 20 MG tablet TAKE ONE TABLET BY MOUTH EVERY EVENING 30 tablet 1   Current Facility-Administered Medications  Medication Dose Route Frequency Provider Last Rate Last Dose  . 0.9 %  sodium chloride infusion  500 mL Intravenous Once Doran Stabler, MD       Allergies  Allergen Reactions  . Penicillins     Throat swelling    Review of Systems Constitutional: negative Eyes: positive for contacts/glasses, irritation, redness and purulent discharge, negative for cataracts, glaucoma, icterus and visual disturbance Ears, nose, mouth, throat, and face: negative Respiratory: negative Cardiovascular: negative   Physical Exam   BP (!) 144/82   Pulse 75   Temp 98.6 F (37 C)   Resp 14   Wt 235 lb 9.6 oz (106.9 kg)   SpO2 96%   BMI 31.08 kg/m  Visual Acuity: OS: 20/20       OD:  20/20 Physical Exam Vitals signs reviewed.  Constitutional:      General: He is not in acute distress.    Appearance: Normal appearance.  HENT:     Head: Normocephalic.  Right Ear: Tympanic membrane and ear canal normal.     Left Ear: Tympanic membrane and ear canal normal.     Nose: Nose normal.  Eyes:     General: Lids are normal. Vision grossly intact. Gaze aligned appropriately. No visual field deficit or scleral icterus.       Right eye: Discharge present.        Left eye: No foreign body, discharge or hordeolum.     Conjunctiva/sclera:     Right eye: Right conjunctiva is injected. Exudate present.     Left eye: Left conjunctiva is not injected. No exudate.    Pupils: Pupils are equal, round, and reactive to light.     Comments: Yellow-green mucopurulent drainage to inner canthus of right eye.  + conjunctival hyperemia. Cornea is clear with mild eyelid swelling  Neck:     Musculoskeletal: Normal range of motion and  neck supple. No neck rigidity or muscular tenderness.  Cardiovascular:     Rate and Rhythm: Normal rate and regular rhythm.     Pulses: Normal pulses.     Heart sounds: Normal heart sounds.  Pulmonary:     Effort: Pulmonary effort is normal.     Breath sounds: Normal breath sounds.  Skin:    General: Skin is warm and dry.     Capillary Refill: Capillary refill takes less than 2 seconds.  Neurological:     General: No focal deficit present.     Mental Status: He is alert.     Cranial Nerves: No cranial nerve deficit.    Assessment and Plan   Exam findings, diagnosis etiology and medication use and indications reviewed with patient. Follow- Up and discharge instructions provided. No emergent/urgent issues found on exam.  Based on the patient's clinical presentation, symptoms, and physical assessment, findings are congruent with that of a bacterial infection.  Patient has yellowish-green mucopurulent drainage from the inner canthus of the right eye which is congruent with a bacterial conjunctivitis.  The patient also denied pain or vision disturbance but did have complaints of itching and "feeling like something was in his eye.  Was also instructed to perform symptomatic treatment to include cool compresses to the eye, ibuprofen or Tylenol for pain, and strict hand hygiene to prevent spread to the other eye.  We will go ahead and treat both eyes at this time.  Patient was also instructed to follow-up with his PCP regarding his blood pressure.  Patient states he currently is not on any medication for his blood pressure.  Patient does have an appointment with his PCP on tomorrow.  Patient education was provided. Patient verbalized understanding of information provided and agrees with plan of care (POC), all questions answered. The patient is advised to call or return to clinic if condition does not see an improvement in symptoms, or to seek the care of the closest emergency department if condition  worsens with the above plan.   1. Bacterial conjunctivitis of right eye  - trimethoprim-polymyxin b (POLYTRIM) ophthalmic solution; Place 2 drops into both eyes every 6 (six) hours for 10 days.  Dispense: 10 mL; Refill: 0 -Use medication as prescribed. -We have diagnosed you with a bacterial conjunctivitis.  This condition is highly contagious.  Strict hand hygiene is essential in preventing spread of this condition. -We will go ahead and treat both eyes as you most likely may develop the same condition in the left eye. -Cool compresses to the affected eye as needed for discomfort. -Ibuprofen or  Tylenol for pain, fever, or general discomfort. -I would like you to follow-up with your PCP for evaluation of your blood pressure.  In the interim, if you develop blurred vision, headache, dizziness, lower extremity swelling, chest pain or palpitations, I would like you to follow-up in the emergency department immediately. -Follow-up in the emergency department if you have loss of vision, worsening drainage, or any change in your vision.

## 2018-09-14 ENCOUNTER — Ambulatory Visit: Payer: Managed Care, Other (non HMO) | Admitting: Medical

## 2018-09-20 ENCOUNTER — Encounter: Payer: Self-pay | Admitting: Medical

## 2018-09-20 ENCOUNTER — Ambulatory Visit: Payer: Managed Care, Other (non HMO) | Admitting: Medical

## 2018-09-20 VITALS — BP 120/70 | HR 52 | Temp 98.0°F | Resp 16 | Ht 72.0 in | Wt 230.0 lb

## 2018-09-20 DIAGNOSIS — R05 Cough: Secondary | ICD-10-CM | POA: Diagnosis not present

## 2018-09-20 DIAGNOSIS — H10029 Other mucopurulent conjunctivitis, unspecified eye: Secondary | ICD-10-CM

## 2018-09-20 DIAGNOSIS — J0110 Acute frontal sinusitis, unspecified: Secondary | ICD-10-CM | POA: Diagnosis not present

## 2018-09-20 DIAGNOSIS — R059 Cough, unspecified: Secondary | ICD-10-CM

## 2018-09-20 MED ORDER — AZITHROMYCIN 250 MG PO TABS: ORAL_TABLET | ORAL | 0 refills | Status: DC

## 2018-09-20 NOTE — Progress Notes (Signed)
Subjective:  Dale Good is a 53 y.o. male who presents for  Chief Complaint  Patient presents with  . sinus    st, congestion, cough, X 1week worse starting saturday   Symptoms include 1 week hx/o illness, not feeling well.  Last week was seen at Urgent Care for same symptoms.  Was seen for these cold and sinus symptoms as well as pink eye.  Wasn't prescribe anything for cold symptoms, just eye drops for pink eye.    Currently he reports cough, congestion, tightness in eyes, sore throat, worse with cough spells, sinus pressure.   No fever, no body aches, no chills.   No teeth pain.  Using Daygirl and Nyquil.    reports sick contacts, including grandson with pink eye.   Patient is not a smoker.  No hx/o asthma or lung disease.  No other aggravating or relieving factors.  No other complaint.    Past Medical History:  Diagnosis Date  . Allergy   . Arthritis   . BPH (benign prostatic hypertrophy)   . Erectile dysfunction   . Family history of prostate cancer   . GERD (gastroesophageal reflux disease)   . Hyperlipidemia   . Normal cardiac stress test 2015   Dr. Tollie Eth  . Wears glasses     Current Outpatient Medications on File Prior to Visit  Medication Sig Dispense Refill  . Multiple Vitamin (MULTIVITAMIN) tablet Take 1 tablet by mouth daily.    Marland Kitchen OVER THE COUNTER MEDICATION Fish Oil 1000 mg. One capsule daily.    . pravastatin (PRAVACHOL) 20 MG tablet TAKE ONE TABLET BY MOUTH EVERY EVENING 30 tablet 1  . trimethoprim-polymyxin b (POLYTRIM) ophthalmic solution Place 2 drops into both eyes every 6 (six) hours for 10 days. (Patient not taking: Reported on 09/20/2018) 10 mL 0   Current Facility-Administered Medications on File Prior to Visit  Medication Dose Route Frequency Provider Last Rate Last Dose  . 0.9 %  sodium chloride infusion  500 mL Intravenous Once Danis, Estill Cotta III, MD        ROS as in subjective   Objective: BP 120/70   Pulse (!) 52   Temp 98 F  (36.7 C) (Oral)   Resp 16   Ht 6' (1.829 m)   Wt 230 lb (104.3 kg)   SpO2 95%   BMI 31.19 kg/m   General appearance: Alert, well developed, well nourished, no distress                             Skin: warm, no rash                           Head: + sinus tenderness,                            Eyes: conjunctiva pink, corneas clear                            Ears: flat left tympanic membrane, flat right tympanic membrane, external ear canals normal                          Nose: septum midline, turbinates swollen, with erythema and clear discharge             Mouth/throat:  MMM, tongue normal, mild pharyngeal erythema                           Neck: supple, no adenopathy, no thyromegaly, non tender                         Lungs: clear, no wheezes, no rales, no rhonchi        Assessment  Encounter Diagnoses  Name Primary?  . Acute non-recurrent frontal sinusitis Yes  . Cough   . Pink eye disease, unspecified laterality       Plan: Discussed diagnoses.   Discussed usual time frame to see improvement. Discussed possible complications or symptoms that would prompt call back or recheck within the next few days.   Pink eye is mostly resolved   Medications prescribed:  Complete the course of Zpak antibiotic prescribed today.   C/t Dayquil/Nyquil for cough suppression  Specific home care recommendations discussed:  Pain/fever relief: You may use over-the-counter Tylenol for pain or fever  Drink extra fluids. Fluids help thin the mucus so your sinuses can drain more easily.   Applying either moist heat or ice packs to the sinus areas may help relieve discomfort.  Use saline nasal sprays to help moisten your sinuses. The sprays can be found at your local drugstore.   Patient was advised to call or return if worse or not improving in the next few days.    Patient voiced understanding of diagnosis, recommendations, and treatment plan.

## 2018-09-20 NOTE — Patient Instructions (Signed)
Recommendations:  Continue the Dayquil/Nyquil combo a few more days  Begin Zpak antibiotic daily for the entire prescription  Drink plenty of water throughout the day  Consider salt water gargles, warm fluids to clear mucous out of the throat  You can use Tylenol for pain or aches  If not much improved by Monday, then call back

## 2018-11-02 ENCOUNTER — Other Ambulatory Visit: Payer: Self-pay | Admitting: Medical

## 2019-02-19 ENCOUNTER — Other Ambulatory Visit: Payer: Self-pay | Admitting: Urology

## 2019-02-19 DIAGNOSIS — R972 Elevated prostate specific antigen [PSA]: Secondary | ICD-10-CM

## 2019-03-02 ENCOUNTER — Other Ambulatory Visit: Payer: Self-pay | Admitting: Medical

## 2019-03-15 ENCOUNTER — Ambulatory Visit
Admission: RE | Admit: 2019-03-15 | Discharge: 2019-03-15 | Disposition: A | Discharge status: Home / Self Care | Payer: Managed Care, Other (non HMO) | Source: Ambulatory Visit | Attending: Urology | Admitting: Urology

## 2019-03-15 ENCOUNTER — Other Ambulatory Visit: Payer: Self-pay

## 2019-03-15 DIAGNOSIS — R972 Elevated prostate specific antigen [PSA]: Secondary | ICD-10-CM

## 2019-03-15 MED ORDER — GADOBENATE DIMEGLUMINE 529 MG/ML IV SOLN
20.0000 mL | Freq: Once | INTRAVENOUS | Status: AC | PRN
  Administered 2019-03-15: 20 mL via INTRAVENOUS

## 2019-03-17 HISTORY — PX: PROSTATE BIOPSY: SHX241

## 2019-06-08 ENCOUNTER — Other Ambulatory Visit: Payer: Self-pay | Admitting: Medical

## 2019-06-28 ENCOUNTER — Other Ambulatory Visit: Payer: Self-pay

## 2019-06-28 ENCOUNTER — Ambulatory Visit: Payer: Managed Care, Other (non HMO) | Admitting: Medical

## 2019-06-28 ENCOUNTER — Encounter: Payer: Self-pay | Admitting: Medical

## 2019-06-28 VITALS — BP 110/70 | HR 48 | Temp 97.8°F | Ht 72.0 in | Wt 229.2 lb

## 2019-06-28 DIAGNOSIS — N400 Enlarged prostate without lower urinary tract symptoms: Secondary | ICD-10-CM | POA: Diagnosis not present

## 2019-06-28 DIAGNOSIS — E785 Hyperlipidemia, unspecified: Secondary | ICD-10-CM

## 2019-06-28 DIAGNOSIS — D126 Benign neoplasm of colon, unspecified: Secondary | ICD-10-CM | POA: Diagnosis not present

## 2019-06-28 DIAGNOSIS — Z8042 Family history of malignant neoplasm of prostate: Secondary | ICD-10-CM

## 2019-06-28 DIAGNOSIS — Z0000 Encounter for general adult medical examination without abnormal findings: Secondary | ICD-10-CM | POA: Diagnosis not present

## 2019-06-28 DIAGNOSIS — R001 Bradycardia, unspecified: Secondary | ICD-10-CM | POA: Diagnosis not present

## 2019-06-28 DIAGNOSIS — E790 Hyperuricemia without signs of inflammatory arthritis and tophaceous disease: Secondary | ICD-10-CM

## 2019-06-28 DIAGNOSIS — Z2820 Immunization not carried out because of patient decision for unspecified reason: Secondary | ICD-10-CM

## 2019-06-28 DIAGNOSIS — R972 Elevated prostate specific antigen [PSA]: Secondary | ICD-10-CM | POA: Diagnosis not present

## 2019-06-28 DIAGNOSIS — Z6830 Body mass index (BMI) 30.0-30.9, adult: Secondary | ICD-10-CM | POA: Insufficient documentation

## 2019-06-28 DIAGNOSIS — Z7189 Other specified counseling: Secondary | ICD-10-CM

## 2019-06-28 DIAGNOSIS — Z7185 Encounter for immunization safety counseling: Secondary | ICD-10-CM

## 2019-06-28 DIAGNOSIS — Z114 Encounter for screening for human immunodeficiency virus [HIV]: Secondary | ICD-10-CM

## 2019-06-28 NOTE — Patient Instructions (Addendum)
Thanks for trusting [place] with your health care and for coming in for a physical today.  Below are some general recommendations I have for you:  Yearly screenings See your eye doctor yearly for routine vision care. See your dentist yearly for routine dental care including hygiene visits twice yearly. See me here yearly for a routine physical and preventative care visit   Specific Concerns today:  . Shingles vaccine:  I recommend you have a shingles vaccine to help prevent shingles or herpes zoster outbreak.   Please call your insurer to inquire about coverage for the Shingrix vaccine given in 2 doses.   Some insurers cover this vaccine after age 36, some cover this after age 50.  If your insurer covers this, then call to schedule appointment to have this vaccine here. . Work on getting back to keto type diet but limit meat servings to lean meats, deck of card size servings, preferably baked or grilled, not red meat.  Don't eat meat every meal.  Avoid butter/margarine.   . DO eat fish, avocado, olive oil, and small portions of nuts. . Get AEROBIC exercise   Exercise: I recommend exercising most days of the week using a type of exercise that you would enjoy and stick to such as walking, running, swimming, hiking, biking, aerobics, etc. This needs to be at least 30-40 minutes at a time, at least 5 days/week with moderate intensity.  This would be 60-70% of your maximum heart rate.  For example, your maximal heart rate would be 200- your age, multiplied x 0.6 to equal 60% of your maximal heart rate.    Thus, a person age 63 would have a maximum heart rate of 160.  60% of this would be 96 beats per minute.  Thus for fat burning exercise, a 53 year old would want to exercise has sustained heart rate of about 96 bpm for fat burning.  However for heart health, they would want to exercise at a higher heart rate of about 75% of maximum heart rate which would be a heart rate of about 120 beats per minute.  So  I recommend a combination of doing some exercise at moderate intensity, and some exercise at vigorous intensity for heart health.   Low Carb Diet recommendations:  I recommend you drink water throughout the day.   70 ounces or 2 liters would be a good amount.  If you have been accustomed to drinking juice or soda, try water with lemon or water with lime, or try using no calorie flavor dropper.  I recommend the following as an example meal plan that includes 3 meals per day.   You can skip some meals periodically for intermittent fasting.  Breakfast (choose one): Marland Kitchen Omelette with egg, can include a little bit of cheese, your choice of mushrooms, peppers, onions, salsa . Smoothie with handful of spinach or kale, 1 cup of milk or water, 1 cup of berries, 1 packet of artificial sweetener such as stevia . Yogurt with fruit   Mid morning snack: . 1 fruit serving and 1 protein such as 8 almonds or 8 nuts, or vegetable such as carrots and humus or other similar vegetable   Lunch: . Salad with 3-4 ounces of lean grilled or baked meat such as fish, chicken or [place]   Mid afternoon snack: . 1 fruit serving and 1 protein such as 8 almonds or 8 nuts, or vegetable such as carrots and humus or other similar vegetable   Dinner: . Large serving  of vegetables and 3-4 ounces of lean grilled or baked meat such as fish, chicken or [place] . Or vegetarian dish without meat    Avoid  . Chips, cookies, cake, donuts, soda, sweet tea, juices, candy, fast food . For now , avoid, or significantly limit grains    Please follow up yearly for a physical.   Preventative Care for Adults - Male      [place]:  A routine yearly physical is a good way to check in with your primary care provider about your health and preventive screening. It is also an opportunity to share updates about your health and any concerns you have, and receive a thorough all-over exam.   Most health insurance  companies pay for at least some preventative services.  Check with your health plan for specific coverages.  WHAT PREVENTATIVE SERVICES DO MEN NEED?  Adult men should have their weight and blood pressure checked regularly.   Men age 54 and older should have their cholesterol levels checked regularly.  Beginning at age 74 and continuing to age 71, men should be screened for colorectal cancer.  Certain people may need continued testing until age 2.  Updating vaccinations is part of preventative care.  Vaccinations help protect against diseases such as the flu.  Osteoporosis is a disease in which the bones lose minerals and strength as we age. Men ages 64 and over should discuss this with their caregivers  Lab tests are generally done as part of preventative care to screen for anemia and blood disorders, to screen for problems with the kidneys and liver, to screen for bladder problems, to check blood sugar, and to check your cholesterol level.  Preventative services generally include counseling about diet, exercise, avoiding tobacco, drugs, excessive alcohol consumption, and sexually transmitted infections.    GENERAL RECOMMENDATIONS FOR GOOD HEALTH:  Healthy diet:  Eat a variety of foods, including fruit, vegetables, animal or vegetable protein, such as meat, fish, chicken, and eggs, or beans, lentils, tofu, and grains, such as rice.  Drink plenty of water daily.  Decrease saturated fat in the diet, avoid lots of red meat, processed foods, sweets, fast foods, and fried foods.  Exercise:  Aerobic exercise helps maintain good heart health. At least 30-40 minutes of moderate-intensity exercise is recommended. For example, a brisk walk that increases your heart rate and breathing. This should be done on most days of the week.   Find a type of exercise or a variety of exercises that you enjoy so that it becomes a part of your daily life.  Examples are running, walking, swimming, water  aerobics, and biking.  For motivation and support, explore group exercise such as aerobic class, spin class, Zumba, Yoga,or  martial arts, etc.    Set exercise goals for yourself, such as a certain weight goal, walk or run in a race such as a 5k walk/run.  Speak to your primary care provider about exercise goals.  Disease prevention:  If you smoke or chew tobacco, find out from your caregiver how to quit. It can literally save your life, no matter how long you have been a tobacco user. If you do not use tobacco, never begin.   Maintain a healthy diet and normal weight. Increased weight leads to problems with blood pressure and diabetes.   The Body Mass Index or BMI is a way of measuring how much of your body is fat. Having a BMI above 27 increases the risk of heart disease,  diabetes, hypertension, stroke and other problems related to obesity. Your caregiver can help determine your BMI and based on it develop an exercise and dietary program to help you achieve or maintain this important measurement at a healthful level.  High blood pressure causes heart and blood vessel problems.  Persistent high blood pressure should be treated with medicine if weight loss and exercise do not work.   Fat and cholesterol leaves deposits in your arteries that can block them. This causes heart disease and vessel disease elsewhere in your body.  If your cholesterol is found to be high, or if you have heart disease or certain other medical conditions, then you may need to have your cholesterol monitored frequently and be treated with medication.   Ask if you should have a cardiac stress test if your history suggests this. A stress test is a test done on a treadmill that looks for heart disease. This test can find disease prior to there being a problem.  Osteoporosis is a disease in which the bones lose minerals and strength as we age. This can result in serious bone fractures. Risk of osteoporosis can be identified  using a bone density scan. Men ages 24 and over should discuss this with their caregivers. Ask your caregiver whether you should be taking a calcium supplement and Vitamin D, to reduce the rate of osteoporosis.   Avoid drinking alcohol in excess (more than two drinks per day).  Avoid use of street drugs. Do not share needles with anyone. Ask for professional help if you need assistance or instructions on stopping the use of alcohol, cigarettes, and/or drugs.  Brush your teeth twice a day with fluoride toothpaste, and floss once a day. Good oral hygiene prevents tooth decay and gum disease. The problems can be painful, unattractive, and can cause other health problems. Visit your dentist for a routine oral and dental check up and preventive care every 6-12 months.   Look at your skin regularly.  Use a mirror to look at your back. Notify your caregivers of changes in moles, especially if there are changes in shapes, colors, a size larger than a pencil eraser, an irregular border, or development of new moles.  Safety:  Use seatbelts 100% of the time, whether driving or as a passenger.  Use safety devices such as hearing protection if you work in environments with loud noise or significant background noise.  Use safety glasses when doing any work that could send debris in to the eyes.  Use a helmet if you ride a bike or motorcycle.  Use appropriate safety gear for contact sports.  Talk to your caregiver about gun safety.  Use sunscreen with a SPF (or skin protection factor) of 15 or greater.  Lighter skinned people are at a greater risk of skin cancer. Don't forget to also wear sunglasses in order to protect your eyes from too much damaging sunlight. Damaging sunlight can accelerate cataract formation.   Practice safe sex. Use condoms. Condoms are used for birth control and to help reduce the spread of sexually transmitted infections (or STIs).  Some of the STIs are gonorrhea (the clap), chlamydia,  syphilis, trichomonas, herpes, HPV (human papilloma virus) and HIV (human immunodeficiency virus) which causes AIDS. The herpes, HIV and HPV are viral illnesses that have no cure. These can result in disability, cancer and death.   Keep carbon monoxide and smoke detectors in your home functioning at all times. Change the batteries every 6 months or use a  model that plugs into the wall.   Vaccinations:  Stay up to date with your tetanus shots and other required immunizations. You should have a booster for tetanus every 10 years. Be sure to get your flu shot every year, since 5%-20% of the [place] population comes down with the flu. The flu vaccine changes each year, so being vaccinated once is not enough. Get your shot in the fall, before the flu season peaks.   Other vaccines to consider:  Human Papilloma Virus or HPV causes cancer of the cervix, and other infections that can be transmitted from person to person. There is a vaccine for HPV, and males should get immunized between the ages of 26 and 4. It requires a series of 3 shots.   Pneumococcal vaccine to protect against certain types of pneumonia.  This is normally recommended for adults age 83 or older.  However, adults younger than 54 years old with certain underlying conditions such as diabetes, heart or lung disease should also receive the vaccine.  Shingles vaccine to protect against Varicella Zoster if you are older than age 13, or younger than 53 years old with certain underlying illness.  If you have not had the Shingrix vaccine, please call your insurer to inquire about coverage for the Shingrix vaccine given in 2 doses.   Some insurers cover this vaccine after age 35, some cover this after age 57.  If your insurer covers this, then call to schedule appointment to have this vaccine here  Hepatitis A vaccine to protect against a form of infection of the liver by a virus acquired from food.  Hepatitis B vaccine to protect against a form of  infection of the liver by a virus acquired from blood or body fluids, particularly if you work in health care.  If you plan to travel internationally, check with your local health department for specific vaccination recommendations.   What should I know about cancer screening? Many types of cancers can be detected early and may often be prevented. Lung Cancer  You should be screened every year for lung cancer if: ? You are a current smoker who has smoked for at least 30 years. ? You are a former smoker who has quit within the past 15 years.  Talk to your health care provider about your screening options, when you should start screening, and how often you should be screened.  Colorectal Cancer  Routine colorectal cancer screening usually begins at 53 years of age and should be repeated every 5-10 years until you are 53 years old. You may need to be screened more often if early forms of precancerous polyps or small growths are found. Your health care provider may recommend screening at an earlier age if you have risk factors for colon cancer.  Your health care provider may recommend using home test kits to check for hidden blood in the stool.  A small camera at the end of a tube can be used to examine your colon (sigmoidoscopy or colonoscopy). This checks for the earliest forms of colorectal cancer.  Prostate and Testicular Cancer  Depending on your age and overall health, your health care provider may do certain tests to screen for prostate and testicular cancer.  Talk to your health care provider about any symptoms or concerns you have about testicular or prostate cancer.  Skin Cancer  Check your skin from head to toe regularly.  Tell your health care provider about any new moles or changes in moles, especially if: ?  There is a change in a mole's size, shape, or color. ? You have a mole that is larger than a pencil eraser.  Always use sunscreen. Apply sunscreen liberally and  repeat throughout the day.  Protect yourself by wearing long sleeves, pants, a wide-brimmed hat, and sunglasses when outside.
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
## 2019-06-29 ENCOUNTER — Other Ambulatory Visit: Payer: Self-pay | Admitting: Medical

## 2019-06-29 LAB — COMPREHENSIVE METABOLIC PANEL
ALT: 37 IU/L (ref 0–44)
AST: 26 IU/L (ref 0–40)
Albumin/Globulin Ratio: 1.8 (ref 1.2–2.2)
Albumin: 4.8 g/dL (ref 3.8–4.9)
Alkaline Phosphatase: 80 IU/L (ref 39–117)
BUN/Creatinine Ratio: 12 (ref 9–20)
BUN: 15 mg/dL (ref 6–24)
Bilirubin Total: 0.4 mg/dL (ref 0.0–1.2)
CO2: 22 mmol/L (ref 20–29)
Calcium: 9.7 mg/dL (ref 8.7–10.2)
Chloride: 102 mmol/L (ref 96–106)
Creatinine, Ser: 1.22 mg/dL (ref 0.76–1.27)
GFR calc Af Amer: 78 mL/min/{1.73_m2} (ref >59)
GFR calc non Af Amer: 67 mL/min/{1.73_m2} (ref >59)
Globulin, Total: 2.6 g/dL (ref 1.5–4.5)
Glucose: 103 mg/dL — ABNORMAL HIGH (ref 65–99)
Potassium: 5.3 mmol/L — ABNORMAL HIGH (ref 3.5–5.2)
Sodium: 139 mmol/L (ref 134–144)
Total Protein: 7.4 g/dL (ref 6.0–8.5)

## 2019-06-29 LAB — CBC WITH DIFFERENTIAL/PLATELET
Basophils Absolute: 0 10*3/uL (ref 0.0–0.2)
Basos: 0 %
EOS (ABSOLUTE): 0 10*3/uL (ref 0.0–0.4)
Eos: 1 %
Hematocrit: 46.7 % (ref 37.5–51.0)
Hemoglobin: 15.5 g/dL (ref 13.0–17.7)
Immature Grans (Abs): 0 10*3/uL (ref 0.0–0.1)
Immature Granulocytes: 0 %
Lymphocytes Absolute: 1.9 10*3/uL (ref 0.7–3.1)
Lymphs: 54 %
MCH: 27.8 pg (ref 26.6–33.0)
MCHC: 33.2 g/dL (ref 31.5–35.7)
MCV: 84 fL (ref 79–97)
Monocytes Absolute: 0.4 10*3/uL (ref 0.1–0.9)
Monocytes: 10 %
Neutrophils Absolute: 1.2 10*3/uL — ABNORMAL LOW (ref 1.4–7.0)
Neutrophils: 35 %
Platelets: 247 10*3/uL (ref 150–450)
RBC: 5.58 x10E6/uL (ref 4.14–5.80)
RDW: 13.8 % (ref 11.6–15.4)
WBC: 3.5 10*3/uL (ref 3.4–10.8)

## 2019-06-29 LAB — LIPID PANEL
Chol/HDL Ratio: 4.1 ratio (ref 0.0–5.0)
Cholesterol, Total: 207 mg/dL — ABNORMAL HIGH (ref 100–199)
HDL: 51 mg/dL (ref >39)
LDL Chol Calc (NIH): 144 mg/dL — ABNORMAL HIGH (ref 0–99)
Triglycerides: 69 mg/dL (ref 0–149)
VLDL Cholesterol Cal: 12 mg/dL (ref 5–40)

## 2019-06-29 LAB — URIC ACID: Uric Acid: 7.5 mg/dL (ref 3.7–8.6)

## 2019-06-29 MED ORDER — PRAVASTATIN SODIUM 20 MG PO TABS: 20.0000 mg | ORAL_TABLET | Freq: Every evening | ORAL | 3 refills | Status: DC

## 2019-10-26 ENCOUNTER — Ambulatory Visit: Payer: Self-pay

## 2019-10-27 ENCOUNTER — Ambulatory Visit: Payer: Self-pay

## 2019-11-05 ENCOUNTER — Ambulatory Visit: Payer: Managed Care, Other (non HMO) | Attending: Internal Medicine

## 2019-11-05 DIAGNOSIS — Z23 Encounter for immunization: Secondary | ICD-10-CM

## 2019-11-05 NOTE — Progress Notes (Signed)
   Covid-19 Vaccination Clinic  Name:  Dale Good    MRN: [id number] DOB: Sep 06, 1965  11/05/2019  Dale Good was observed post Covid-19 immunization for 15 minutes without incident. He was provided with Vaccine Information Sheet and instruction to access the V-Safe system.   Dale Good was instructed to call 911 with any severe reactions post vaccine: Marland Kitchen Difficulty breathing  . Swelling of face and throat  . A fast heartbeat  . A bad rash all over body  . Dizziness and weakness   Immunizations Administered    Name Date Dose VIS Date Route   Pfizer COVID-19 Vaccine 11/05/2019 10:54 AM 0.3 mL 07/27/2019 Intramuscular   Manufacturer: [place]   Lot: [id number]   [place]: [id number]

## 2019-11-28 ENCOUNTER — Ambulatory Visit: Payer: Managed Care, Other (non HMO) | Attending: Internal Medicine

## 2019-11-28 DIAGNOSIS — Z23 Encounter for immunization: Secondary | ICD-10-CM

## 2019-11-28 NOTE — Progress Notes (Signed)
   Covid-19 Vaccination Clinic  Name:  Dale Good    MRN: [id number] DOB: May 19, 1966  11/28/2019  [name] was observed post Covid-19 immunization for 15 minutes without incident. He was provided with Vaccine Information Sheet and instruction to access the V-Safe system.   [name] was instructed to call 911 with any severe reactions post vaccine: Marland Kitchen Difficulty breathing  . Swelling of face and throat  . A fast heartbeat  . A bad rash all over body  . Dizziness and weakness   Immunizations Administered    Name Date Dose VIS Date Route   Pfizer COVID-19 Vaccine 11/28/2019 11:07 AM 0.3 mL 07/27/2019 Intramuscular   Manufacturer: [place]   Lot: [id number]   [place]: [id number]

## 2020-05-07 ENCOUNTER — Ambulatory Visit: Payer: Managed Care, Other (non HMO) | Admitting: Medical

## 2020-05-07 ENCOUNTER — Encounter: Payer: Self-pay | Admitting: Medical

## 2020-05-07 VITALS — BP 126/80 | HR 52 | Ht 72.0 in | Wt 238.0 lb

## 2020-05-07 DIAGNOSIS — M2142 Flat foot [pes planus] (acquired), left foot: Secondary | ICD-10-CM

## 2020-05-07 DIAGNOSIS — M79672 Pain in left foot: Secondary | ICD-10-CM | POA: Diagnosis not present

## 2020-05-07 DIAGNOSIS — Z8781 Personal history of (healed) traumatic fracture: Secondary | ICD-10-CM | POA: Diagnosis not present

## 2020-05-07 DIAGNOSIS — M21619 Bunion of unspecified foot: Secondary | ICD-10-CM | POA: Diagnosis not present

## 2020-05-07 DIAGNOSIS — Z2821 Immunization not carried out because of patient refusal: Secondary | ICD-10-CM

## 2020-05-07 NOTE — Patient Instructions (Signed)
Please go to [facility] for your left foot xray.   Their hours are 8am - 4:30 pm Monday - Friday.  Take your insurance card with you.  [facility] [phone]  [address], [address] [place], [place] [address]  [address] [place], [place] [address] 

## 2020-05-07 NOTE — Progress Notes (Signed)
Subjective:  Dale Good is a 54 y.o. male who presents for Chief Complaint  Patient presents with  . Foot Pain    left-hurt on Monday-does not hurt today      Here for left foot pain.  Started Monday 2 days ago.  Wondered about arthritis vs gout.   No injury, no fall, no trauma.   Pain is mostly mid foot top and bottom.  Bought some different tennis shoes recently.   He notes hx/o fracture of left foot in the 1980s playing basketball.  No new activity.  Used voltaren gel he has.   This helped some.  No other aggravating or relieving factors.    No other c/o.  The following portions of the patient's history were reviewed and updated as appropriate: allergies, current medications, past family history, past medical history, past social history, past surgical history and problem list.  ROS Otherwise as in subjective above  Objective: BP 126/80   Pulse (!) 52   Ht 6' (1.829 m)   Wt 238 lb (108 kg)   SpO2 98%   BMI 32.28 kg/m   General appearance: alert, no distress, well developed, well nourished, African American Skin unremarkable MSK:  Mildly tender over left foot metatarsals of 1-3 toes.  Mild bunion on left, flat arches, otherwise nontender, no swelling, no deformity, normal ROM of toes without pain, normal ROM of ankle without pain Pulses: 2+ radial pulses, 2+ pedal pulses, normal cap refill Ext: no edema Foot and leg neurovascularly intact    Assessment: Encounter Diagnoses  Name Primary?  . Left foot pain Yes  . History of foot fracture   . Flat foot, acquired, left   . Bunion   . Influenza vaccination declined      Plan: We discussed his pain issues.  I suspect osteoarthritis given the history of fracture and given the findings.  He is also flat-footed.  Advised to use good arch support, can use short-term therapies such as cool water bath, topical Voltaren that he already has, Tylenol by mouth.  We will send him for x-ray.  We discussed that this is probably  not gout.  Follow-up pending x-ray  He declines influenza vaccine today   [name] was seen today for foot pain.  Diagnoses and all orders for this visit:  Left foot pain -     DG Foot Complete Left; Future  History of foot fracture -     DG Foot Complete Left; Future  Flat foot, acquired, left -     DG Foot Complete Left; Future  Bunion -     DG Foot Complete Left; Future  Influenza vaccination declined    Follow up: pending xray

## 2020-07-03 ENCOUNTER — Encounter: Payer: Managed Care, Other (non HMO) | Admitting: Medical

## 2020-09-05 ENCOUNTER — Encounter (HOSPITAL_COMMUNITY): Payer: Self-pay

## 2020-09-05 ENCOUNTER — Ambulatory Visit (HOSPITAL_COMMUNITY)
Admission: EM | Admit: 2020-09-05 | Discharge: 2020-09-05 | Disposition: A | Discharge status: Home / Self Care | Payer: Managed Care, Other (non HMO) | Attending: Emergency Medicine | Admitting: Emergency Medicine

## 2020-09-05 ENCOUNTER — Other Ambulatory Visit: Payer: Self-pay | Admitting: Medical

## 2020-09-05 ENCOUNTER — Other Ambulatory Visit: Payer: Self-pay

## 2020-09-05 DIAGNOSIS — R059 Cough, unspecified: Secondary | ICD-10-CM | POA: Diagnosis not present

## 2020-09-05 DIAGNOSIS — Z79811 Long term (current) use of aromatase inhibitors: Secondary | ICD-10-CM | POA: Diagnosis not present

## 2020-09-05 DIAGNOSIS — R509 Fever, unspecified: Secondary | ICD-10-CM | POA: Diagnosis not present

## 2020-09-05 DIAGNOSIS — Z880 Allergy status to penicillin: Secondary | ICD-10-CM | POA: Diagnosis not present

## 2020-09-05 DIAGNOSIS — R6883 Chills (without fever): Secondary | ICD-10-CM

## 2020-09-05 DIAGNOSIS — U071 COVID-19: Secondary | ICD-10-CM | POA: Insufficient documentation

## 2020-09-05 HISTORY — DX: Hyperlipidemia, unspecified: E78.5

## 2020-09-05 LAB — SARS CORONAVIRUS 2 (TAT 6-24 HRS): SARS Coronavirus 2: POSITIVE — AB

## 2020-09-05 MED ORDER — BENZONATATE 100 MG PO CAPS: 100.0000 mg | ORAL_CAPSULE | Freq: Three times a day (TID) | ORAL | 0 refills | Status: DC | PRN
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
## 2020-09-05 NOTE — ED Triage Notes (Signed)
Pt in with c/o chills and mild cough that started Wednesday night  Pt has been taking dayquil and nyquil for sxs

## 2020-09-05 NOTE — Discharge Instructions (Signed)
Self isolate until covid results are back.  We will notify you by phone if it is positive. Your negative results will be sent through your MyChart.    If it is positive you need to isolate from others for a total of 5 days. If no fever for 24 hours without medications, and symptoms improving you may end isolation on day 6, but wear a mask if around any others for an additional 5 days.   Push fluids to ensure adequate hydration and keep secretions thin.  You may try some vitamins to help your immune system potentially:  Vitamin C 500mg  twice a day. Zinc 50mg  daily. Vitamin D 5000IU daily.   Tessalon as needed for cough.  Over the counter medications as needed for symptoms.  Return for any worsening or persistent symptoms such as shortness of breath , chest pain or illness exceeding 10 days.

## 2020-09-05 NOTE — Telephone Encounter (Signed)
Patient has been advised to schedule an appointment for continued refills on medications.

## 2020-09-10 ENCOUNTER — Ambulatory Visit: Payer: Managed Care, Other (non HMO) | Admitting: Medical

## 2020-09-10 ENCOUNTER — Other Ambulatory Visit: Payer: Self-pay

## 2020-09-10 ENCOUNTER — Encounter: Payer: Self-pay | Admitting: Medical

## 2020-09-10 VITALS — BP 138/84 | HR 60 | Temp 98.3°F | Ht 72.0 in | Wt 234.2 lb

## 2020-09-10 DIAGNOSIS — E785 Hyperlipidemia, unspecified: Secondary | ICD-10-CM

## 2020-09-10 DIAGNOSIS — Z8616 Personal history of COVID-19: Secondary | ICD-10-CM | POA: Insufficient documentation

## 2020-09-10 DIAGNOSIS — E790 Hyperuricemia without signs of inflammatory arthritis and tophaceous disease: Secondary | ICD-10-CM | POA: Diagnosis not present

## 2020-09-10 DIAGNOSIS — Z8042 Family history of malignant neoplasm of prostate: Secondary | ICD-10-CM

## 2020-09-10 DIAGNOSIS — Z7185 Encounter for immunization safety counseling: Secondary | ICD-10-CM | POA: Diagnosis not present

## 2020-09-10 DIAGNOSIS — R972 Elevated prostate specific antigen [PSA]: Secondary | ICD-10-CM

## 2020-09-10 NOTE — Progress Notes (Signed)
Subjective:  Dale Good is a 56 y.o. male who presents for Chief Complaint  Patient presents with  . Medication Management    Need refills on medication       Medical team: Dr. Link Good, [facility] Dr. Wilfrid Good, [facility] Dentist Dale Good, Dale Eng, PA-C here for primary care  Here for routine med check.  He ran out of his cholesterol medicine a few weeks ago.  He has had his Covid vaccine this past year but just had a mild Covid illness within the past 2 weeks.  He feels fine now.  He only had mild symptoms.  Otherwise normal state of health  History of elevated PSA.  Didn't seen urology in 2021, but due now for f/u.  Last visit had MRI of prostate, and no evidence of cancer.   He has appt upcoming.    No other aggravating or relieving factors.    No other c/o.  Past Medical History:  Diagnosis Date  . Allergy   . Arthralgia   . BPH (benign prostatic hypertrophy)   . Elevated PSA 2018   2020 consult with urology, biopsies negative, c/t f/u with urology, Dr. Link Good  . Elevated uric acid in blood 2020  . Erectile dysfunction   . Family history of prostate cancer   . GERD (gastroesophageal reflux disease)   . Hyperlipemia   . Hyperlipidemia   . Normal cardiac stress test 2015   Dr. Tollie Eth  . Wears glasses      The following portions of the patient's history were reviewed and updated as appropriate: allergies, current medications, past family history, past medical history, past social history, past surgical history and problem list.  ROS Otherwise as in subjective above    Objective: BP 138/84   Pulse 60   Temp 98.3 F (36.8 C)   Ht 6' (1.829 m)   Wt 234 lb 3.2 oz (106.2 kg)   SpO2 97%   BMI 31.76 kg/m    BP Readings from Last 3 Encounters:  09/10/20 138/84  09/05/20 132/79  05/07/20 126/80   Wt Readings from Last 3 Encounters:  09/10/20 234 lb 3.2 oz (106.2 kg)  05/07/20 238 lb (108 kg)  06/28/19 229 lb 3.2 oz  (104 kg)     General appearance: alert, no distress, well developed, well nourished Neck: supple, no lymphadenopathy, no thyromegaly, no masses, no bruits Heart: RRR, normal S1, S2, no murmurs Lungs: CTA bilaterally, no wheezes, rhonchi, or rales Abdomen: +bs, soft, non tender, non distended, no masses, no hepatomegaly, no splenomegaly, no bruits Pulses: 2+ radial pulses, 2+ pedal pulses, normal cap refill Ext: no edema   Assessment: Encounter Diagnoses  Name Primary?  . Hyperlipidemia, unspecified hyperlipidemia type Yes  . Elevated uric acid in blood   . Vaccine counseling   . Family history of prostate cancer   . Elevated PSA   . History of COVID-19      Plan: I advised that he should be coming in yearly for a physical and fasting labs.  Somehow he missed his 2021 physical visit.  Labs as below today fasting  Hyperlipidemia-plan to continue statin  Elevated uric acid in the blood in the past without gout or obvious kidney damage.  We will continue to monitor this.  Consider allopurinol  Elevated PSA, family history of prostate cancer-I reviewed his 2020 urology note.  He has not been back in 2021.  He plans to go back soon and has an  appointment scheduled.  PSA lab today as below  Shingles vaccine:  I recommend you have a shingles vaccine to help prevent shingles or herpes zoster outbreak.   Please call your insurer to inquire about coverage for the Shingrix vaccine given in 2 doses.   Some insurers cover this vaccine after age 38, some cover this after age 70.  If your insurer covers this, then call to schedule appointment to have this vaccine here.  History of Covid infection-resolved of symptoms from recent infection in the past 2 weeks  Dale Good was seen today for medication management.  Diagnoses and all orders for this visit:  Hyperlipidemia, unspecified hyperlipidemia type -     Lipid panel -     Comprehensive metabolic panel  Elevated uric acid in blood -      Uric acid  Vaccine counseling  Family history of prostate cancer -     PSA, total and free  Elevated PSA -     PSA, total and free  History of COVID-19    Follow up: Pending labs

## 2020-09-11 ENCOUNTER — Other Ambulatory Visit: Payer: Self-pay | Admitting: Medical

## 2020-09-11 DIAGNOSIS — R7989 Other specified abnormal findings of blood chemistry: Secondary | ICD-10-CM

## 2020-09-11 LAB — LIPID PANEL
Chol/HDL Ratio: 5.7 ratio — ABNORMAL HIGH (ref 0.0–5.0)
Cholesterol, Total: 229 mg/dL — ABNORMAL HIGH (ref 100–199)
HDL: 40 mg/dL (ref >39)
LDL Chol Calc (NIH): 167 mg/dL — ABNORMAL HIGH (ref 0–99)
Triglycerides: 120 mg/dL (ref 0–149)
VLDL Cholesterol Cal: 22 mg/dL (ref 5–40)

## 2020-09-11 LAB — COMPREHENSIVE METABOLIC PANEL
ALT: 62 IU/L — ABNORMAL HIGH (ref 0–44)
AST: 44 IU/L — ABNORMAL HIGH (ref 0–40)
Albumin/Globulin Ratio: 1.8 (ref 1.2–2.2)
Albumin: 4.7 g/dL (ref 3.8–4.9)
Alkaline Phosphatase: 79 IU/L (ref 44–121)
BUN/Creatinine Ratio: 10 (ref 9–20)
BUN: 12 mg/dL (ref 6–24)
Bilirubin Total: 0.4 mg/dL (ref 0.0–1.2)
CO2: 25 mmol/L (ref 20–29)
Calcium: 9.6 mg/dL (ref 8.7–10.2)
Chloride: 102 mmol/L (ref 96–106)
Creatinine, Ser: 1.21 mg/dL (ref 0.76–1.27)
GFR calc Af Amer: 77 mL/min/{1.73_m2} (ref >59)
GFR calc non Af Amer: 67 mL/min/{1.73_m2} (ref >59)
Globulin, Total: 2.6 g/dL (ref 1.5–4.5)
Glucose: 98 mg/dL (ref 65–99)
Potassium: 5 mmol/L (ref 3.5–5.2)
Sodium: 141 mmol/L (ref 134–144)
Total Protein: 7.3 g/dL (ref 6.0–8.5)

## 2020-09-11 LAB — PSA, TOTAL AND FREE
PSA, Free Pct: 15 %
PSA, Free: 1.91 ng/mL
Prostate Specific Ag, Serum: 12.7 ng/mL — ABNORMAL HIGH (ref 0.0–4.0)

## 2020-09-11 LAB — URIC ACID: Uric Acid: 6.6 mg/dL (ref 3.8–8.4)

## 2020-09-11 MED ORDER — PRAVASTATIN SODIUM 20 MG PO TABS: 20.0000 mg | ORAL_TABLET | Freq: Every evening | ORAL | 3 refills | Status: DC

## 2020-09-13 LAB — SPECIMEN STATUS REPORT

## 2020-09-13 LAB — HEPATITIS B SURFACE ANTIGEN: Hepatitis B Surface Ag: NEGATIVE

## 2020-09-13 LAB — IRON: Iron: 109 ug/dL (ref 38–169)

## 2020-10-03 ENCOUNTER — Other Ambulatory Visit: Payer: Self-pay | Admitting: Urology

## 2020-10-03 DIAGNOSIS — R972 Elevated prostate specific antigen [PSA]: Secondary | ICD-10-CM

## 2020-11-05 ENCOUNTER — Ambulatory Visit
Admission: RE | Admit: 2020-11-05 | Discharge: 2020-11-05 | Disposition: A | Discharge status: Home / Self Care | Payer: Managed Care, Other (non HMO) | Source: Ambulatory Visit | Attending: Urology | Admitting: Urology

## 2020-11-05 DIAGNOSIS — R972 Elevated prostate specific antigen [PSA]: Secondary | ICD-10-CM

## 2020-11-05 MED ORDER — GADOBENATE DIMEGLUMINE 529 MG/ML IV SOLN
20.0000 mL | Freq: Once | INTRAVENOUS | Status: AC | PRN
  Administered 2020-11-05: 20 mL via INTRAVENOUS

## 2021-02-25 ENCOUNTER — Other Ambulatory Visit: Payer: Self-pay

## 2021-02-25 ENCOUNTER — Ambulatory Visit: Payer: Managed Care, Other (non HMO) | Admitting: Medical

## 2021-02-25 VITALS — BP 128/72 | HR 49 | Temp 97.1°F | Wt 240.0 lb

## 2021-02-25 DIAGNOSIS — R7989 Other specified abnormal findings of blood chemistry: Secondary | ICD-10-CM

## 2021-02-25 DIAGNOSIS — Z6832 Body mass index (BMI) 32.0-32.9, adult: Secondary | ICD-10-CM

## 2021-02-25 DIAGNOSIS — E785 Hyperlipidemia, unspecified: Secondary | ICD-10-CM | POA: Diagnosis not present

## 2021-02-25 DIAGNOSIS — R972 Elevated prostate specific antigen [PSA]: Secondary | ICD-10-CM | POA: Diagnosis not present

## 2021-02-25 DIAGNOSIS — L299 Pruritus, unspecified: Secondary | ICD-10-CM | POA: Diagnosis not present

## 2021-02-25 MED ORDER — HYDROXYZINE HCL 10 MG PO TABS: 10.0000 mg | ORAL_TABLET | Freq: Two times a day (BID) | ORAL | 0 refills | Status: DC

## 2021-02-25 MED ORDER — PRAVASTATIN SODIUM 20 MG PO TABS: 20.0000 mg | ORAL_TABLET | Freq: Every evening | ORAL | 2 refills | Status: DC

## 2021-02-25 NOTE — Progress Notes (Signed)
Subjective:  Dale Good is a 55 y.o. male who presents for Chief Complaint  Patient presents with   itching    Alll over . Happens after  sweating al lot and mostly at night     Here for itching for a few weeks.  Gets sweaty working in the day.  After showering in evening is ok, but by bedtime starts to get itchy in various places, chest, arms, back, legs.  No rash.  Doesn't notice itching in the day time.  He works mowing yards, doing some tree work.   No other new exposures.   Legs get dry.   Using a lotion some but it don't seem to help  Hyperlipemia - compliant with statin since last visit but doesn't use it Wednesday Thursday or Fridays  Last visit 08/2020 he had elevated LFTs. His alcohol use is minimal.  He denies hepatitis exposure.     No other aggravating or relieving factors.    No other c/o.  Past Medical History:  Diagnosis Date   Allergy    Arthralgia    BPH (benign prostatic hypertrophy)    Elevated PSA 2018   2020 consult with urology, biopsies negative, c/t f/u with urology, Dr. Link Snuffer   Elevated uric acid in blood 2020   Erectile dysfunction    Family history of prostate cancer    GERD (gastroesophageal reflux disease)    Hyperlipemia    Hyperlipidemia    Normal cardiac stress test 2015   Dr. Tollie Eth   Wears glasses    Current Outpatient Medications on File Prior to Visit  Medication Sig Dispense Refill   Multiple Vitamin (MULTIVITAMIN) tablet Take 1 tablet by mouth daily.     Current Facility-Administered Medications on File Prior to Visit  Medication Dose Route Frequency Provider Last Rate Last Admin   0.9 %  sodium chloride infusion  500 mL Intravenous Once Danis, Estill Cotta III, MD        The following portions of the patient's history were reviewed and updated as appropriate: allergies, current medications, past family history, past medical history, past social history, past surgical history and problem list.  ROS Otherwise as in  subjective above    Objective: BP 128/72   Pulse (!) 49   Temp (!) 97.1 F (36.2 C)   Wt 240 lb (108.9 kg)   SpO2 98%   BMI 32.55 kg/m   General appearance: alert, no distress, well developed, well nourished Neck: supple, no lymphadenopathy, no thyromegaly, no masses Heart: RRR, normal S1, S2, no murmurs Abdomen: +bs, soft, non tender, non distended, no masses, no hepatomegaly, no splenomegaly Pulses: 2+ radial pulses, 2+ pedal pulses, normal cap refill Ext: no edema Skin: no rash, no obvious lesions   Assessment: Encounter Diagnoses  Name Primary?   Pruritic condition Yes   Elevated PSA    BMI 32.0-32.9,adult    Hyperlipidemia, unspecified hyperlipidemia type    Elevated LFTs      Plan: Itching - etiology could be irritants in yard since he does some yard work, mowing, tree work.  Could be dry skin, possible liver issue.  Begin Hydroxyzine once or BID.   Begin different moisturizing lotion  Hyperlipidemia - non fasting today.   Not taking statin every day, only about 4 days per week.   Advised daily use.   Recheck labs next visit hopefully when fasting  Elevated LFTs - discussed possible differential.  Prior negative Hep B and Iron normal 08/2020.  Negative  hep C antibody 2018.  I suspect fatty liver disease.   If labs still elevated, consider ultrasound of liver.  Advised healthy low fat diet, regularly exercise  Elevated PSA - sees urology, had recent MRI of prostate not showing any worrisome findings.  Trigger was seen today for itching.  Diagnoses and all orders for this visit:  Pruritic condition -     Hepatic function panel -     Sedimentation rate  Elevated PSA  BMI 32.0-32.9,adult  Hyperlipidemia, unspecified hyperlipidemia type -     Hepatic function panel -     Cancel: Lipid panel  Elevated LFTs  Other orders -     pravastatin (PRAVACHOL) 20 MG tablet; Take 1 tablet (20 mg total) by mouth every evening. -     hydrOXYzine (ATARAX/VISTARIL) 10 MG  tablet; Take 1-2 tablets (10-20 mg total) by mouth 2 (two) times daily at 10 AM and 5 PM.   Follow up: pending lab

## 2021-02-26 LAB — HEPATIC FUNCTION PANEL
ALT: 61 IU/L — ABNORMAL HIGH (ref 0–44)
AST: 31 IU/L (ref 0–40)
Albumin: 4.9 g/dL (ref 3.8–4.9)
Alkaline Phosphatase: 93 IU/L (ref 44–121)
Bilirubin Total: 0.2 mg/dL (ref 0.0–1.2)
Bilirubin, Direct: <0.1 mg/dL (ref 0.00–0.40)
Total Protein: 7.4 g/dL (ref 6.0–8.5)

## 2021-02-26 LAB — SEDIMENTATION RATE: Sed Rate: 2 mm/hr (ref 0–30)

## 2021-02-26 NOTE — Progress Notes (Signed)
Lvm for pt to call office for labs or if he has questions after seeing them on my chart. Dale Good

## 2021-03-04 ENCOUNTER — Ambulatory Visit (INDEPENDENT_AMBULATORY_CARE_PROVIDER_SITE_OTHER): Payer: Managed Care, Other (non HMO)

## 2021-03-04 ENCOUNTER — Other Ambulatory Visit: Payer: Self-pay

## 2021-03-04 ENCOUNTER — Ambulatory Visit: Payer: Managed Care, Other (non HMO) | Admitting: Podiatry

## 2021-03-04 DIAGNOSIS — M205X2 Other deformities of toe(s) (acquired), left foot: Secondary | ICD-10-CM | POA: Diagnosis not present

## 2021-03-04 DIAGNOSIS — M778 Other enthesopathies, not elsewhere classified: Secondary | ICD-10-CM | POA: Diagnosis not present

## 2021-03-04 NOTE — Progress Notes (Signed)
   HPI: 55 y.o. male presenting today as a new patient for evaluation of left great toe pain.  Patient states that his left great toe occasionally locks up on him and he has had chronic pain to this toe for several years.  It is slowly gotten worse despite different conservative treatment options including shoe gear modifications.  He states that when he was in his late 63s, he was diagnosed with arthritis of the left foot.  He presents for further treatment and evaluation  Past Medical History:  Diagnosis Date   Allergy    Arthralgia    BPH (benign prostatic hypertrophy)    Elevated PSA 2018   2020 consult with urology, biopsies negative, c/t f/u with urology, Dr. Link Snuffer   Elevated uric acid in blood 2020   Erectile dysfunction    Family history of prostate cancer    GERD (gastroesophageal reflux disease)    Hyperlipemia    Hyperlipidemia    Normal cardiac stress test 2015   Dr. Tollie Eth   Wears glasses      Physical Exam: General: The patient is alert and oriented x3 in no acute distress.  Dermatology: Skin is warm, dry and supple bilateral lower extremities. Negative for open lesions or macerations.  Vascular: Palpable pedal pulses bilaterally. No edema or erythema noted. Capillary refill within normal limits.  Neurological: Epicritic and protective threshold grossly intact bilaterally.   Musculoskeletal Exam: No pedal deformity.  Enlargement noted clinically of the first MTPJ of the left foot.  Limited range of motion with pain on palpation and range of motion of the first MTPJ left  Radiographic Exam:  Normal osseous mineralization.  Degenerative changes noted to the first MTPJ left with periarticular spurring and erosion of the joint.  Assessment: 1.  Hallux limitus left   Plan of Care:  1. Patient evaluated. X-Rays reviewed.  2. Today we discussed the conservative versus surgical management of the presenting pathology. The patient opts for surgical  management. All possible complications and details of the procedure were explained. All patient questions were answered. No guarantees were expressed or implied. 3. Authorization for surgery was initiated today. Surgery will consist of left great toe arthroplasty with implant 4.  Return to clinic 1 week postop  *Driver for [employer].  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 Edrick Kins, DPM    [address], [place] [address]                Office [phone]  Fax [phone]

## 2021-04-03 ENCOUNTER — Encounter (HOSPITAL_COMMUNITY): Payer: Self-pay

## 2021-04-03 ENCOUNTER — Emergency Department (HOSPITAL_COMMUNITY)
Admission: EM | Admit: 2021-04-03 | Discharge: 2021-04-03 | Disposition: A | Discharge status: Home / Self Care | Payer: Managed Care, Other (non HMO) | Attending: Emergency Medicine | Admitting: Emergency Medicine

## 2021-04-03 ENCOUNTER — Ambulatory Visit (HOSPITAL_COMMUNITY)
Admission: EM | Admit: 2021-04-03 | Discharge: 2021-04-03 | Disposition: A | Discharge status: Home / Self Care | Payer: Managed Care, Other (non HMO) | Attending: Medical Oncology | Admitting: Medical Oncology

## 2021-04-03 ENCOUNTER — Other Ambulatory Visit: Payer: Self-pay

## 2021-04-03 ENCOUNTER — Encounter (HOSPITAL_COMMUNITY): Payer: Self-pay | Admitting: Emergency Medicine

## 2021-04-03 DIAGNOSIS — H209 Unspecified iridocyclitis: Secondary | ICD-10-CM | POA: Insufficient documentation

## 2021-04-03 DIAGNOSIS — Z79899 Other long term (current) drug therapy: Secondary | ICD-10-CM | POA: Diagnosis not present

## 2021-04-03 DIAGNOSIS — H5712 Ocular pain, left eye: Secondary | ICD-10-CM | POA: Diagnosis not present

## 2021-04-03 MED ORDER — FLUORESCEIN SODIUM 1 MG OP STRP
1.0000 | ORAL_STRIP | Freq: Once | OPHTHALMIC | Status: AC
  Administered 2021-04-03: 1 via OPHTHALMIC
  Filled 2021-04-03: qty 1

## 2021-04-03 MED ORDER — TETRACAINE HCL 0.5 % OP SOLN
2.0000 [drp] | Freq: Once | OPHTHALMIC | Status: AC
  Administered 2021-04-03: 2 [drp] via OPHTHALMIC
  Filled 2021-04-03: qty 4
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
## 2021-04-03 NOTE — ED Provider Notes (Signed)
Emergency Medicine Provider Triage Evaluation Note  Dale Good , a 55 y.o. male  was evaluated in triage.  Pt complains of left-sided eye pain.  Patient with 3 days of progressively worsening left eye pain and red eye.  He is very light sensitive.  No history of eye surgeries or glaucoma.  He has been using over-the-counter saline drops without improvement.  He does not feel like his vision is appreciably changed, blurry.  No loss of vision.  No vomiting.  Review of Systems  Positive: Eye pain Negative: Fever  Physical Exam  BP (!) 141/83 (BP Location: Right Arm)   Pulse (!) 51   Temp 98.3 F (36.8 C) (Oral)   Resp 18   Ht 6' (1.829 m)   Wt 103 kg   SpO2 99%   BMI 30.79 kg/m  Gen:   Awake, no distress   Resp:  Normal effort  MSK:   Moves extremities without difficulty  Other:  Conjunctival and scleral injection on the left.  Plus of photosensitivity.  Consensual pain with light shined into the right eye.  No swelling or erythema around the eye itself.  EOMI.  Medical Decision Making  Medically screening exam initiated at 9:37 PM.  Appropriate orders placed.  Dale Good was informed that the remainder of the evaluation will be completed by another provider, this initial triage assessment does not replace that evaluation, and the importance of remaining in the ED until their evaluation is complete.     Carlisle Cater, PA-C 04/03/21 2139    Wyvonnia Dusky, MD 04/04/21 [phone]

## 2021-04-03 NOTE — Discharge Instructions (Addendum)
Follow-up with Dr. Kathlen Mody in the clinic at 9 AM tomorrow, 04/04/2021.  You may continue with Tylenol or ibuprofen until this follow-up visit.  Additional medications may be prescribed when you are seen in clinic.

## 2021-04-03 NOTE — Discharge Instructions (Addendum)
Please have someone take you directly to the emergency room.

## 2021-04-03 NOTE — ED Triage Notes (Signed)
Patient arrives complaining of eye pain. Patient sent over by urgent care. Patient states his eyes began hurting yesterday and the pain increased today. Patient complaining of photosensitivity. [facility] sent patient for possible MRI.

## 2021-04-03 NOTE — ED Triage Notes (Signed)
Pt in with what he thinks may be pink eye. States his eye has been watering more than usual, is painful and red  Pt denies any vision changes
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
## 2021-05-13 ENCOUNTER — Telehealth: Payer: Self-pay | Admitting: Urology

## 2021-05-13 NOTE — Telephone Encounter (Signed)
DOS - 06/11/21  KELLER BUNION IMPLANT LEFT --- [id number]  [employer] EFFECTIVE DATE - 08/16/05  SPOKE WITH [employer]'S AUTOMATIVE SYSTEM AND IT STATED THAT FOR CPT CODE 90379 NO PRIOR AUTH IS REQUIRED.  REF # [id number]

## 2021-05-16 HISTORY — PX: TOE SURGERY: SHX1073

## 2021-05-26 ENCOUNTER — Encounter: Payer: Self-pay | Admitting: Podiatry

## 2021-05-26 ENCOUNTER — Ambulatory Visit: Payer: Managed Care, Other (non HMO) | Admitting: Podiatry

## 2021-05-26 ENCOUNTER — Other Ambulatory Visit: Payer: Self-pay

## 2021-05-26 DIAGNOSIS — M7752 Other enthesopathy of left foot: Secondary | ICD-10-CM

## 2021-05-26 DIAGNOSIS — M205X2 Other deformities of toe(s) (acquired), left foot: Secondary | ICD-10-CM

## 2021-05-26 MED ORDER — DEXAMETHASONE SODIUM PHOSPHATE 120 MG/30ML IJ SOLN
4.0000 mg | Freq: Once | INTRAMUSCULAR | Status: AC
  Administered 2021-05-26: 4 mg via INTRA_ARTICULAR

## 2021-05-26 NOTE — Progress Notes (Signed)
  Subjective:  Patient ID: Dale Good, male    DOB: 02-15-1966,   MRN: [id number]  Chief Complaint  Patient presents with   Toe Pain    Follow up left toe pain. Pt states he is in office today because his toe pain is increasing.     55 y.o. male presents for left foot great toe pain. He has been seen by Dr. Amalia Hailey and is scheduled for surgery at the end of this month. States he had increased pain in starting this past Saturday. Has been taking aleve with minimal relief. Hoping to do something to relief the pain until surgery . Denies any other pedal complaints. Denies n/v/f/c.   Past Medical History:  Diagnosis Date   Allergy    Arthralgia    BPH (benign prostatic hypertrophy)    Elevated PSA 2018   2020 consult with urology, biopsies negative, c/t f/u with urology, Dr. Link Snuffer   Elevated uric acid in blood 2020   Erectile dysfunction    Family history of prostate cancer    GERD (gastroesophageal reflux disease)    Hyperlipemia    Hyperlipidemia    Normal cardiac stress test 2015   Dr. Tollie Eth   Wears glasses     Objective:  Physical Exam: Vascular: DP/PT pulses 2/4 bilateral. CFT <3 seconds. Normal hair growth on digits. No edema.  Skin. No lacerations or abrasions bilateral feet.  Edema to the left first MPJ.  Musculoskeletal: MMT 5/5 bilateral lower extremities in DF, PF, Inversion and Eversion. Deceased ROM in DF of ankle joint. Severely tender to palpation of first MPJ and with ROM of the joint.  Neurological: Sensation intact to light touch.   Assessment:   1. Hallux limitus, left   2. Capsulitis of toe, left      Plan:  Patient was evaluated and treated and all questions answered. -Patient to have surgery with Dr. Amalia Hailey on the 22nd of this month.  -Discussed reducing inflammation in the area until them. Patient would like try an injection. Procedure below.  -Patient to return to office as needed or sooner if condition worsens.  Procedure:  Injection Tendon/Ligament Discussed alternatives, risks, complications and verbal consent was obtained.  Location: Left first metatarsophalangeal joint. Skin Prep: Alcohol. Injectate: 1cc 2%lidocaine plain, 1 cc dexamethasone.  Disposition: Patient tolerated procedure well. Injection site dressed with a band-aid.  Post-injection care was discussed and return precautions discussed.     Lorenda Peck, DPM

## 2021-05-27 ENCOUNTER — Ambulatory Visit: Payer: Managed Care, Other (non HMO) | Admitting: Physician Assistant

## 2021-05-28 ENCOUNTER — Other Ambulatory Visit: Payer: Self-pay

## 2021-05-28 ENCOUNTER — Ambulatory Visit: Payer: Managed Care, Other (non HMO) | Admitting: Medical

## 2021-05-28 VITALS — BP 140/80 | HR 49 | Temp 97.1°F | Wt 242.6 lb

## 2021-05-28 DIAGNOSIS — M79675 Pain in left toe(s): Secondary | ICD-10-CM

## 2021-05-28 DIAGNOSIS — M205X2 Other deformities of toe(s) (acquired), left foot: Secondary | ICD-10-CM | POA: Diagnosis not present

## 2021-05-28 MED ORDER — COLCHICINE 0.6 MG PO TABS: 0.6000 mg | ORAL_TABLET | Freq: Two times a day (BID) | ORAL | 0 refills | Status: DC

## 2021-05-28 NOTE — Progress Notes (Signed)
Subjective:  Dale Good is a 55 y.o. male who presents for Chief Complaint  Patient presents with   Toe Pain    Toe pain. Might be gout x saturday     Here for toe pain left foot, concerned about gout.  He notes current left great toe pain started 5 days ago,  no injury, no fall, no trauma.  Just started hurting when he awoke.    Has been swollen and pinkish red.  No warmth.  Sees podiatry.   Is having surgery end of this month for toe arthritis issues.   He called podiatry and had joint injection of steroid 2 days ago in same left great toe.  He feels like between the shot and tart cherry juice, its a little better today.  No other aggravating or relieving factors.    No other c/o.  The following portions of the patient's history were reviewed and updated as appropriate: allergies, current medications, past family history, past medical history, past social history, past surgical history and problem list.  ROS Otherwise as in subjective above    Objective: BP 140/80   Pulse (!) 49 Comment: normal for pt  Temp (!) 97.1 F (36.2 C)   Wt 242 lb 9.6 oz (110 kg)   BMI 32.90 kg/m   General appearance: alert, no distress, well developed, well nourished Tender of left great toe which seems to have bony arthritis changes at MTP and general swelling of the great toe.   Pink coloration but no warmth, no pus drainage . Great toe ROM decreased.   Thickened brown nails throughout Pulses: 2+ radial pulses, 2+ pedal pulses, normal cap refill Ext: no edema   Assessment: Encounter Diagnoses  Name Primary?   Great toe pain, left Yes   Hallux limitus of left foot      Plan: We discussed his symptoms and concerns and exam findings.  I actually called and spoke to Dr. Blenda Mounts with podiatry who saw him earlier in the week.  He has surgery planned for the end the month for significant arthritis.  We discussed going ahead and checking some labs today and treating his acute pain.  He just had  a steroid injection of his toes this week.  Spoke to supervising physician Dr. Redmond School about this as well.  Begin colchicine, rest, follow-up pending labs.  Follow-up with podiatry.  [name] was seen today for toe pain.  Diagnoses and all orders for this visit:  Great toe pain, left -     Uric acid -     Sedimentation rate  Hallux limitus of left foot -     Uric acid -     Sedimentation rate  Other orders -     colchicine 0.6 MG tablet; Take 1 tablet (0.6 mg total) by mouth 2 (two) times daily.   Follow up: with podiatry

## 2021-05-29 LAB — URIC ACID: Uric Acid: 8 mg/dL (ref 3.8–8.4)

## 2021-05-29 LAB — SEDIMENTATION RATE: Sed Rate: 8 mm/hr (ref 0–30)

## 2021-05-31 ENCOUNTER — Other Ambulatory Visit: Payer: Self-pay | Admitting: Medical

## 2021-05-31 MED ORDER — ALLOPURINOL 100 MG PO TABS: 100.0000 mg | ORAL_TABLET | Freq: Every day | ORAL | 1 refills | Status: DC

## 2021-06-11 ENCOUNTER — Encounter: Payer: Self-pay | Admitting: Podiatry

## 2021-06-11 ENCOUNTER — Other Ambulatory Visit: Payer: Self-pay | Admitting: Podiatry

## 2021-06-11 DIAGNOSIS — M205X2 Other deformities of toe(s) (acquired), left foot: Secondary | ICD-10-CM | POA: Diagnosis not present

## 2021-06-11 MED ORDER — DICLOFENAC SODIUM 75 MG PO TBEC: 75.0000 mg | DELAYED_RELEASE_TABLET | Freq: Two times a day (BID) | ORAL | 1 refills | Status: DC

## 2021-06-11 MED ORDER — OXYCODONE-ACETAMINOPHEN 5-325 MG PO TABS: 1.0000 | ORAL_TABLET | ORAL | 0 refills | Status: DC | PRN

## 2021-06-11 NOTE — Progress Notes (Signed)
PRN postop 

## 2021-06-17 ENCOUNTER — Ambulatory Visit (INDEPENDENT_AMBULATORY_CARE_PROVIDER_SITE_OTHER): Payer: Managed Care, Other (non HMO)

## 2021-06-17 ENCOUNTER — Encounter: Payer: Self-pay | Admitting: Podiatry

## 2021-06-17 ENCOUNTER — Ambulatory Visit (INDEPENDENT_AMBULATORY_CARE_PROVIDER_SITE_OTHER): Payer: Managed Care, Other (non HMO) | Admitting: Podiatry

## 2021-06-17 ENCOUNTER — Other Ambulatory Visit: Payer: Self-pay

## 2021-06-17 DIAGNOSIS — M205X2 Other deformities of toe(s) (acquired), left foot: Secondary | ICD-10-CM

## 2021-06-17 DIAGNOSIS — Z9889 Other specified postprocedural states: Secondary | ICD-10-CM

## 2021-06-17 NOTE — Progress Notes (Signed)
10  Subjective:  Patient presents today status post left great toe arthroplasty with implant. DOS: 06/11/2021.  Patient states that he is feeling well.  He only had 1 night of pain.  He has not had to take any of the pain medication recently he has been wearing the cam boot as instructed and dressings have been clean dry and intact since surgery.  Presents today with his wife  Past Medical History:  Diagnosis Date   Allergy    Arthralgia    BPH (benign prostatic hypertrophy)    Elevated PSA 2018   2020 consult with urology, biopsies negative, c/t f/u with urology, Dr. Link Snuffer   Elevated uric acid in blood 2020   Erectile dysfunction    Family history of prostate cancer    GERD (gastroesophageal reflux disease)    Hyperlipemia    Hyperlipidemia    Normal cardiac stress test 2015   Dr. Tollie Eth   Wears glasses       Objective/Physical Exam Neurovascular status intact.  Skin incisions appear to be well coapted with staples intact. No sign of infectious process noted. No dehiscence. No active bleeding noted. Moderate edema noted to the surgical extremity.  Radiographic Exam:  Implant with grommets and osteotomies sites appear to be stable with routine healing.  Assessment: 1. s/p left great toe arthroplasty with implant. DOS: 06/11/2021   Plan of Care:  1. Patient was evaluated. X-rays reviewed 2.  Incision site looks very good.  He may begin washing and showering and getting the foot wet 3.  Continue minimal weightbearing in the cam boot 4.  Return to clinic in 1 week for staple removal  *Came in today with his wife  Edrick Kins, DPM [facility]  Dr. Edrick Kins, DPM    [address], [place] [address]                Office [phone]  Fax [phone]

## 2021-06-24 ENCOUNTER — Encounter: Payer: Self-pay | Admitting: Podiatry

## 2021-06-24 ENCOUNTER — Other Ambulatory Visit: Payer: Self-pay

## 2021-06-24 ENCOUNTER — Ambulatory Visit (INDEPENDENT_AMBULATORY_CARE_PROVIDER_SITE_OTHER): Payer: Managed Care, Other (non HMO) | Admitting: Podiatry

## 2021-06-24 DIAGNOSIS — Z9889 Other specified postprocedural states: Secondary | ICD-10-CM

## 2021-06-24 NOTE — Progress Notes (Signed)
10  Subjective:  Patient presents today status post left great toe arthroplasty with implant. DOS: 06/11/2021.  Patient continues to do very well.  He has no pain associated to the area.  He has been weightbearing in the cam boot as instructed.  Overall good improvement and no new complaints at this time  Past Medical History:  Diagnosis Date   Allergy    Arthralgia    BPH (benign prostatic hypertrophy)    Elevated PSA 2018   2020 consult with urology, biopsies negative, c/t f/u with urology, Dr. Link Snuffer   Elevated uric acid in blood 2020   Erectile dysfunction    Family history of prostate cancer    GERD (gastroesophageal reflux disease)    Hyperlipemia    Hyperlipidemia    Normal cardiac stress test 2015   Dr. Tollie Eth   Wears glasses       Objective/Physical Exam Neurovascular status intact.  Skin incisions appear to be well coapted with staples intact. No sign of infectious process noted. No dehiscence. No active bleeding noted. Moderate edema noted to the surgical extremity.  Radiographic Exam 06/17/2021:  Implant with grommets and osteotomies sites appear to be stable with routine healing.  Assessment: 1. s/p left great toe arthroplasty with implant. DOS: 06/11/2021   Plan of Care:  1. Patient was evaluated.  2.  Staples removed today. 3.  Patient may begin to transition out of the boot into good supportive sneakers and shoes 4.  Patient may return to work 06/28/2021.  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  He does not need to do any loading or unloading 5.  Return to clinic in 4 weeks  *Came in today with his wife  Edrick Kins, DPM [facility]  Dr. Edrick Kins, DPM    [address] [place],  [address]                Office [phone]  Fax [phone]

## 2021-07-08 ENCOUNTER — Encounter: Payer: Managed Care, Other (non HMO) | Admitting: Medical

## 2021-07-08 ENCOUNTER — Encounter: Payer: Managed Care, Other (non HMO) | Admitting: Podiatry

## 2021-07-22 ENCOUNTER — Other Ambulatory Visit: Payer: Self-pay

## 2021-07-22 ENCOUNTER — Ambulatory Visit (INDEPENDENT_AMBULATORY_CARE_PROVIDER_SITE_OTHER): Payer: Managed Care, Other (non HMO) | Admitting: Podiatry

## 2021-07-22 DIAGNOSIS — Z9889 Other specified postprocedural states: Secondary | ICD-10-CM

## 2021-07-22 NOTE — Progress Notes (Signed)
10  Subjective:  Patient presents today status post left great toe arthroplasty with implant. DOS: 06/11/2021.  Patient is doing very well.  He is basically back to full activity with no restrictions.  He has been working with only slight minimal discomfort intermittently.  Overall he is very satisfied and doing very well  Past Medical History:  Diagnosis Date   Allergy    Arthralgia    BPH (benign prostatic hypertrophy)    Elevated PSA 2018   2020 consult with urology, biopsies negative, c/t f/u with urology, Dr. Link Snuffer   Elevated uric acid in blood 2020   Erectile dysfunction    Family history of prostate cancer    GERD (gastroesophageal reflux disease)    Hyperlipemia    Hyperlipidemia    Normal cardiac stress test 2015   Dr. Tollie Eth   Wears glasses       Objective/Physical Exam Neurovascular status intact.  Skin incisions appear to be well coapted and healed.  No edema noted to the surgical extremity.  There is good range of motion of the first MTP joint.  Negative for any pain on palpation or range of motion Radiographic Exam 06/17/2021:  Implant with grommets and osteotomies sites appear to be stable with routine healing.  Assessment: 1. s/p left great toe arthroplasty with implant. DOS: 06/11/2021   Plan of Care:  1. Patient was evaluated.  2.  Patient is doing very well.  He is back to work full activity.  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  He is tolerating this well 3.  Patient may now resume full activity no restrictions 4.  Return to clinic as needed   Edrick Kins, DPM [facility]  Dr. Edrick Kins, DPM    [address], [place] [address]                Office [phone]  Fax [phone]

## 2021-12-31 ENCOUNTER — Ambulatory Visit (INDEPENDENT_AMBULATORY_CARE_PROVIDER_SITE_OTHER): Payer: Managed Care, Other (non HMO) | Admitting: Medical

## 2021-12-31 ENCOUNTER — Encounter: Payer: Self-pay | Admitting: Medical

## 2021-12-31 VITALS — BP 120/70 | HR 49 | Ht 72.0 in | Wt 238.2 lb

## 2021-12-31 DIAGNOSIS — R001 Bradycardia, unspecified: Secondary | ICD-10-CM

## 2021-12-31 DIAGNOSIS — E790 Hyperuricemia without signs of inflammatory arthritis and tophaceous disease: Secondary | ICD-10-CM | POA: Diagnosis not present

## 2021-12-31 DIAGNOSIS — L989 Disorder of the skin and subcutaneous tissue, unspecified: Secondary | ICD-10-CM

## 2021-12-31 DIAGNOSIS — R7989 Other specified abnormal findings of blood chemistry: Secondary | ICD-10-CM | POA: Diagnosis not present

## 2021-12-31 DIAGNOSIS — Z8042 Family history of malignant neoplasm of prostate: Secondary | ICD-10-CM

## 2021-12-31 DIAGNOSIS — N400 Enlarged prostate without lower urinary tract symptoms: Secondary | ICD-10-CM

## 2021-12-31 DIAGNOSIS — E785 Hyperlipidemia, unspecified: Secondary | ICD-10-CM

## 2021-12-31 DIAGNOSIS — N528 Other male erectile dysfunction: Secondary | ICD-10-CM

## 2021-12-31 DIAGNOSIS — R7301 Impaired fasting glucose: Secondary | ICD-10-CM

## 2021-12-31 DIAGNOSIS — Z6832 Body mass index (BMI) 32.0-32.9, adult: Secondary | ICD-10-CM

## 2021-12-31 DIAGNOSIS — Z0000 Encounter for general adult medical examination without abnormal findings: Secondary | ICD-10-CM | POA: Diagnosis not present

## 2021-12-31 MED ORDER — TRIAMCINOLONE ACETONIDE 0.1 % EX CREA: 1.0000 "application " | TOPICAL_CREAM | Freq: Two times a day (BID) | CUTANEOUS | 0 refills | Status: DC

## 2021-12-31 NOTE — Assessment & Plan Note (Signed)
Work on efforts to lose weight trough health diet and exercise

## 2021-12-31 NOTE — Assessment & Plan Note (Signed)
Longstanding since teenage years.

## 2021-12-31 NOTE — Assessment & Plan Note (Signed)
We discussed skin lesion on the right low back.  He is going to begin triamcinolone cream for a week and a half.  If this lesion does not clear completely up we will need to either perform cryotherapy refer to dermatology or excise

## 2021-12-31 NOTE — Assessment & Plan Note (Signed)
Currently noncompliant with medication.  Updated labs today.  Discussed risk of elevated cholesterol.  Discussed risk and benefits of medications.  We discussed that there are several medication classes to choose from

## 2021-12-31 NOTE — Progress Notes (Signed)
[facility]  Dr. Link Snuffer, urology Dr. Daylene Katayama, podiatry  Heart  US liver   30m right low back   Subjective:   HPI  Dale WEIDLERis a 56y.o. male who presents for Chief Complaint  Patient presents with   fasting cpe    Fasting cpe, no concerns    Patient Care Team: [name], DCamelia Eng PA-C as PCP - General (Family Medicine) BLucas Mallow MD as Consulting Physician (Urology) [name] [name] MD as Consulting Physician (Gastroenterology) Sees dentist Sees eye doctor   Concerns: Doing fine in general.  Just had routine follow up with urology   Reviewed their medical, surgical, family, social, medication, and allergy history and updated chart as appropriate.  Past Medical History:  Diagnosis Date   Allergy    Arthralgia    BPH (benign prostatic hypertrophy)    Elevated PSA 2018   2020 consult with urology, biopsies negative, c/t f/u with urology, Dr. ELink Snuffer  Elevated uric acid in blood 2020   Erectile dysfunction    Family history of prostate cancer    GERD (gastroesophageal reflux disease)    Hyperlipidemia    Normal cardiac stress test 2015   Dr. STollie Eth  Wears glasses     Past Surgical History:  Procedure Laterality Date   COLONOSCOPY  05/2018   tubular adenoma, repeat 2024, Dr. [name]  HAND SURGERY     right hand due to [place] from [place]   [facility], Dr. ELink SnufferIII   TOE SURGERY  05/2021   toe implant    Social History   Socioeconomic History   Marital status: Married    Spouse name: Not on file   Number of children: Not on file   Years of education: Not on file   Highest education level: Not on file  Occupational History   Not on file  Tobacco Use   Smoking status: Never   Smokeless tobacco: Never  Vaping Use   Vaping Use: Never used  Substance and Sexual Activity   Alcohol use: Yes    Comment: On rare occasions   Drug use: No   Sexual  activity: Not on file  Other Topics Concern   Not on file  Social History Narrative   JGypsy Decantwitness, 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  Married, has 2 adult children.  12/2021.   Social Determinants of Health   Financial Resource Strain: Not on file  Food Insecurity: Not on file  Transportation Needs: Not on file  Physical Activity: Not on file  Stress: Not on file  Social Connections: Not on file  Intimate Partner Violence: Not on file    Family History  Problem Relation Age of Onset   Hypertension Mother    Kidney disease Mother        dialysis   Diabetes Mother        amputation   Hypertension Brother    Glaucoma Brother    Cancer Father 730      prostate   Heart disease Neg Hx    Stroke Neg Hx    Colon cancer Neg Hx    Esophageal cancer Neg Hx    Rectal cancer Neg Hx    Stomach cancer Neg Hx      Current Outpatient Medications:    Multiple Vitamin (MULTIVITAMIN) tablet, Take 1 tablet by mouth  daily., Disp: , Rfl:    pravastatin (PRAVACHOL) 20 MG tablet, Take 1 tablet (20 mg total) by mouth every evening., Disp: 90 tablet, Rfl: 2   triamcinolone cream (KENALOG) 0.1 %, Apply 1 application. topically 2 (two) times daily., Disp: 30 g, Rfl: 0   allopurinol (ZYLOPRIM) 100 MG tablet, Take 1 tablet (100 mg total) by mouth daily. After 2 weeks increase to '200mg'$  daily (Patient not taking: Reported on 12/31/2021), Disp: 60 tablet, Rfl: 1   colchicine 0.6 MG tablet, Take 1 tablet (0.6 mg total) by mouth 2 (two) times daily. (Patient not taking: Reported on 12/31/2021), Disp: 30 tablet, Rfl: 0  Allergies  Allergen Reactions   Influenza Vaccines     Got quite sick after vaccination, refuses future flu shots   Penicillins     Throat swelling     Review of Systems  Constitutional:  Negative for chills, fever, malaise/fatigue and weight loss.  HENT:  Negative for congestion, ear pain, hearing loss, sore throat and  tinnitus.   Eyes:  Negative for blurred vision, pain and redness.  Respiratory:  Negative for cough, hemoptysis and shortness of breath.   Cardiovascular:  Negative for chest pain, palpitations, orthopnea, claudication and leg swelling.  Gastrointestinal:  Negative for abdominal pain, blood in stool, constipation, diarrhea, nausea and vomiting.  Genitourinary:  Negative for dysuria, flank pain, frequency, hematuria and urgency.  Musculoskeletal:  Negative for falls, joint pain and myalgias.  Skin:  Negative for itching and rash.  Neurological:  Negative for dizziness, tingling, speech change, weakness and headaches.  Endo/Heme/Allergies:  Negative for polydipsia. Does not bruise/bleed easily.  Psychiatric/Behavioral:  Negative for depression and memory loss. The patient is not nervous/anxious and does not have insomnia.        Objective:  BP 120/70   Pulse (!) 49 Comment: works out  Ht 6' (1.829 m)   Wt 238 lb 3.2 oz (108 kg)   BMI 32.31 kg/m   General appearance: alert, no distress, WD/WN, African American male Skin: Right low back with 5 mm round raised pink-red papular lesion with somewhat unclear border, looks irritated, no other worrisome findings HEENT: normocephalic, conjunctiva/corneas normal, sclerae anicteric, PERRLA, EOMi, nares patent, no discharge or erythema, pharynx normal Oral cavity: MMM, tongue normal, teeth normal Neck: supple, no lymphadenopathy, no thyromegaly, no masses, normal ROM, no bruits Chest: non tender, normal shape and expansion Heart: bradycardia, otherwise RRR, normal S1, S2, no murmurs Lungs: CTA bilaterally, no wheezes, rhonchi, or rales Abdomen: +bs, soft, non tender, non distended, no masses, no hepatomegaly, no splenomegaly, no bruits Back: non tender, normal ROM, no scoliosis Musculoskeletal: upper extremities non tender, no obvious deformity, normal ROM throughout, lower extremities non tender, no obvious deformity, normal ROM  throughout Extremities: no edema, no cyanosis, no clubbing Pulses: 2+ symmetric, upper and lower extremities, normal cap refill Neurological: alert, oriented x 3, CN2-12 intact, strength normal upper extremities and lower extremities, sensation normal throughout, DTRs 2+ throughout, no cerebellar signs, gait normal Psychiatric: normal affect, behavior normal, pleasant  GU: deferred to urology Rectal: deferred to urology     Assessment and Plan :   Encounter Diagnoses  Name Primary?   Encounter for health maintenance examination in adult Yes   Elevated uric acid in blood    Elevated LFTs    BPH without obstruction/lower urinary tract symptoms    BMI 32.0-32.9,adult    Sinus bradycardia    Hyperlipidemia, unspecified hyperlipidemia type    Family history of prostate cancer  Other male erectile dysfunction    Impaired fasting blood sugar    Skin lesion     Physical exam - discussed and counseled on healthy lifestyle, diet, exercise, preventative care, vaccinations, sick and well care, proper use of emergency dept and after hours care, and addressed their concerns.    Health screening: See your eye doctor yearly for routine vision care. See your dentist yearly for routine dental care including hygiene visits twice yearly.   Cancer screening Colonoscopy:  Reviewed colonoscopy on file that is up to date from 2019, TA polyps, repeat 5 years  Elevated PSA, enlarged prostate - continue routine follow up with urology   Vaccinations: Immunization History  Administered Date(s) Administered   PFIZER(Purple Top)SARS-COV-2 Vaccination 11/05/2019, 11/28/2019   Tdap 04/20/2018    Shingles vaccine:  I recommend you have a shingles vaccine to help prevent shingles or herpes zoster outbreak.   Please call your insurer to inquire about coverage for the Shingrix vaccine given in 2 doses.   Some insurers cover this vaccine after age 35, some cover this after age 49.  If your insurer  covers this, then call to schedule appointment to have this vaccine here.   Separate significant chronic issues discussed:  Problem List Items Addressed This Visit     Hyperlipidemia    Currently noncompliant with medication.  Updated labs today.  Discussed risk of elevated cholesterol.  Discussed risk and benefits of medications.  We discussed that there are several medication classes to choose from       Relevant Orders   Lipid panel   BPH without obstruction/lower urinary tract symptoms   Family history of prostate cancer   Erectile dysfunction   Encounter for health maintenance examination in adult - Primary   Relevant Orders   Comprehensive metabolic panel   CBC   Lipid panel   Uric acid   Hemoglobin A1c   Elevated uric acid in blood    We discussed the risk of elevated uric acid as it relates to gout in the kidneys.  Recheck levels today.  He has hesitations about using allopurinol.  He is not using this medicine currently but also no recent gout flareups in last several months       Relevant Orders   Uric acid   Sinus bradycardia    Longstanding since teenage years.       BMI 32.0-32.9,adult    Work on efforts to lose weight trough health diet and exercise       Elevated LFTs    We discussed his elevated liver test from last year which were mildly elevated.  He does not drink significant amounts of alcohol.  He has been screened negative for hepatitis B and C and normal iron levels in recent years.  If labs still elevated consider ultrasound of liver       Skin lesion    We discussed skin lesion on the right low back.  He is going to begin triamcinolone cream for a week and a half.  If this lesion does not clear completely up we will need to either perform cryotherapy refer to dermatology or excise       Impaired fasting blood sugar   Relevant Orders   Hemoglobin A1c     Follow-up pending labs, yearly for physical

## 2021-12-31 NOTE — Assessment & Plan Note (Signed)
We discussed his elevated liver test from last year which were mildly elevated.  He does not drink significant amounts of alcohol.  He has been screened negative for hepatitis B and C and normal iron levels in recent years.  If labs still elevated consider ultrasound of liver

## 2021-12-31 NOTE — Assessment & Plan Note (Addendum)
We discussed the risk of elevated uric acid as it relates to gout in the kidneys.  Recheck levels today.  He has hesitations about using allopurinol.  He is not using this medicine currently but also no recent gout flareups in last several months

## 2022-01-01 ENCOUNTER — Other Ambulatory Visit: Payer: Self-pay | Admitting: Medical

## 2022-01-01 LAB — CBC
Hematocrit: 43.7 % (ref 37.5–51.0)
Hemoglobin: 15 g/dL (ref 13.0–17.7)
MCH: 28.8 pg (ref 26.6–33.0)
MCHC: 34.3 g/dL (ref 31.5–35.7)
MCV: 84 fL (ref 79–97)
Platelets: 220 10*3/uL (ref 150–450)
RBC: 5.21 x10E6/uL (ref 4.14–5.80)
RDW: 14.5 % (ref 11.6–15.4)
WBC: 4.8 10*3/uL (ref 3.4–10.8)

## 2022-01-01 LAB — COMPREHENSIVE METABOLIC PANEL
ALT: 44 IU/L (ref 0–44)
AST: 30 IU/L (ref 0–40)
Albumin/Globulin Ratio: 2 (ref 1.2–2.2)
Albumin: 4.6 g/dL (ref 3.8–4.9)
Alkaline Phosphatase: 79 IU/L (ref 44–121)
BUN/Creatinine Ratio: 11 (ref 9–20)
BUN: 12 mg/dL (ref 6–24)
Bilirubin Total: 0.5 mg/dL (ref 0.0–1.2)
CO2: 26 mmol/L (ref 20–29)
Calcium: 9.4 mg/dL (ref 8.7–10.2)
Chloride: 105 mmol/L (ref 96–106)
Creatinine, Ser: 1.14 mg/dL (ref 0.76–1.27)
Globulin, Total: 2.3 g/dL (ref 1.5–4.5)
Glucose: 91 mg/dL (ref 70–99)
Potassium: 4.8 mmol/L (ref 3.5–5.2)
Sodium: 143 mmol/L (ref 134–144)
Total Protein: 6.9 g/dL (ref 6.0–8.5)
eGFR: 75 mL/min/{1.73_m2} (ref >59)

## 2022-01-01 LAB — LIPID PANEL
Chol/HDL Ratio: 4.6 ratio (ref 0.0–5.0)
Cholesterol, Total: 212 mg/dL — ABNORMAL HIGH (ref 100–199)
HDL: 46 mg/dL (ref >39)
LDL Chol Calc (NIH): 153 mg/dL — ABNORMAL HIGH (ref 0–99)
Triglycerides: 75 mg/dL (ref 0–149)
VLDL Cholesterol Cal: 13 mg/dL (ref 5–40)

## 2022-01-01 LAB — HEMOGLOBIN A1C
Est. average glucose Bld gHb Est-mCnc: 123 mg/dL
Hgb A1c MFr Bld: 5.9 % — ABNORMAL HIGH (ref 4.8–5.6)

## 2022-01-01 LAB — URIC ACID: Uric Acid: 7.7 mg/dL (ref 3.8–8.4)

## 2022-01-01 MED ORDER — ROSUVASTATIN CALCIUM 10 MG PO TABS
10.0000 mg | ORAL_TABLET | Freq: Every day | ORAL | 3 refills | Status: DC
Start: 2022-01-01 — End: 2023-03-04

## 2022-05-25 ENCOUNTER — Encounter: Payer: Self-pay | Admitting: Internal Medicine

## 2022-08-04 IMAGING — MR MR PROSTATE WO/W CM
12 series · 48 of 48 positions shown · IV contrast (Multihance 20cc)
Comparison: 03/15/2019

CLINICAL DATA: Elevated PSA.

EXAM:
MR PROSTATE WITHOUT AND WITH CONTRAST
TECHNIQUE: Multiplanar multisequence MRI images were obtained of the pelvis
centered about the prostate. Pre and post contrast images were
obtained.
CONTRAST:  20mL MULTIHANCE GADOBENATE DIMEGLUMINE 529 MG/ML IV SOLN

[Series 3: T2 · coronal · 3.0mm · 0.56mm/px · 1 of 27 slices shown (1 of 3)]
[im 1/27]
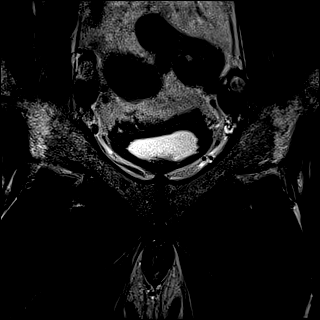

[Series 4: T1 · axial · 5.0mm · 1.25mm/px · z∈[-46,+149]mm · 2 of 80 slices shown]
[im 1/80]
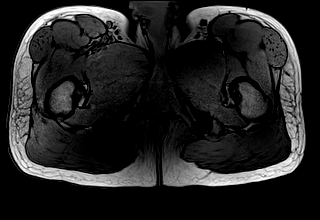
[im 80/80]
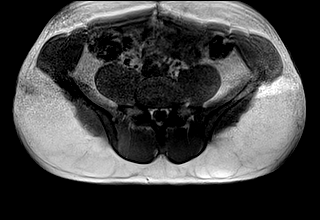

[Series 5: DWI · axial · 3.0mm · 1.75mm/px · z∈[-29,+52]mm · 2 of 83 slices shown (1 of 3)]
[im 1/83]
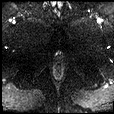
[im 83/83]
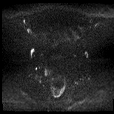

[Series 6: DWI · axial · 3.0mm · 1.75mm/px · 1 of 28 slices shown (2 of 3)]
[im 1/28]
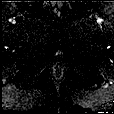

[Series 7: DWI · axial · 3.0mm · 1.75mm/px · 1 of 28 slices shown (3 of 3)]
[im 1/28]
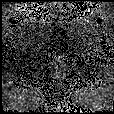

[Series 8: T2 · axial · 3.0mm · 0.56mm/px · 1 of 27 slices shown (2 of 3)]
[im 1/27]
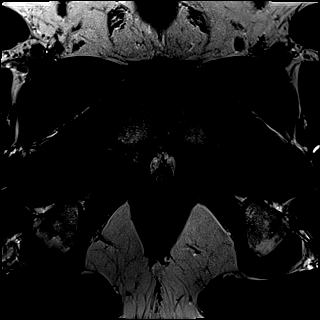

[Series 9: T2 · axial · 1.0mm · 1.04mm/px · z∈[-30,+49]mm · 2 of 80 slices shown (3 of 3)]
[im 1/80]
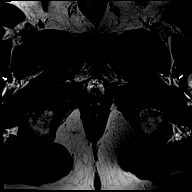
[im 80/80]
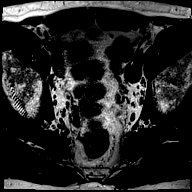

[Series 10: pre t1_twist_tra_dyn · axial · non-contrast · 3.5mm · 0.83mm/px · 1 of 20 slices shown]
[im 1/20]
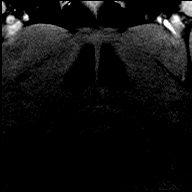

[Series 11: post t1_twist_tra_dyn-copy center · axial · non-contrast · 3.5mm · 0.83mm/px · z∈[-25,+42]mm · 17 of 600 slices shown]
[im 1/600]
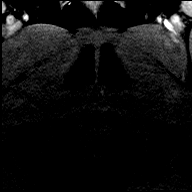
[im 38/600]
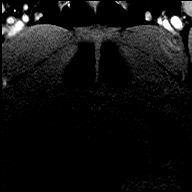
[im 75/600]
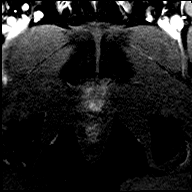
[im 113/600]
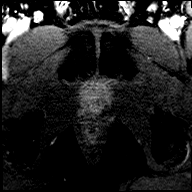
[im 150/600]
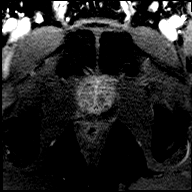
[im 188/600]
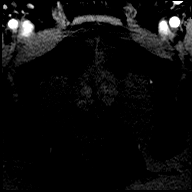
[im 225/600]
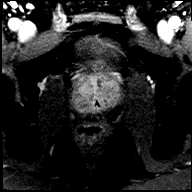
[im 263/600]
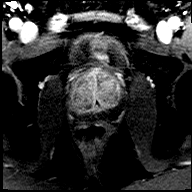
[im 300/600]
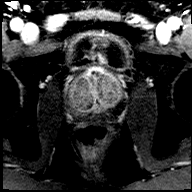
[im 337/600]
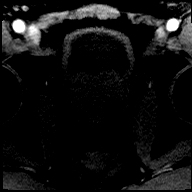
[im 375/600]
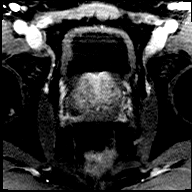
[im 412/600]
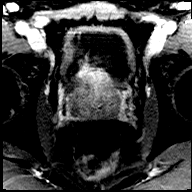
[im 450/600]
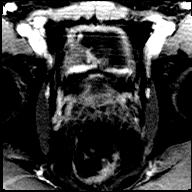
[im 487/600]
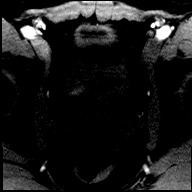
[im 525/600]
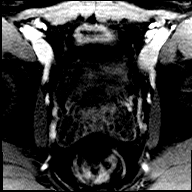
[im 562/600]
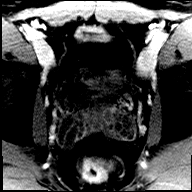
[im 600/600]
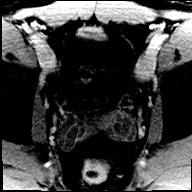

[Series 12: post t1_twist_tra_dyn-copy cent_sub · axial · 3.5mm · 0.83mm/px · z∈[-25,+42]mm · 16 of 580 slices shown]
[im 1/580]
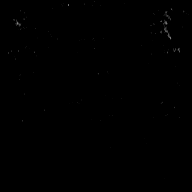
[im 39/580]
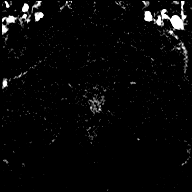
[im 78/580]
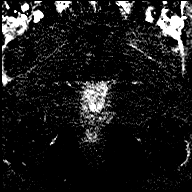
[im 116/580]
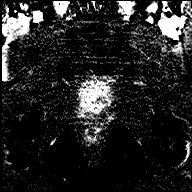
[im 155/580]
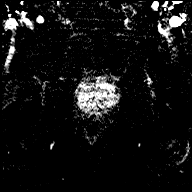
[im 194/580]
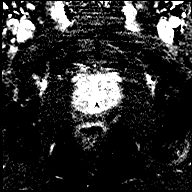
[im 232/580]
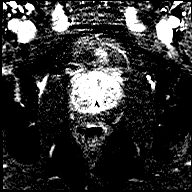
[im 271/580]
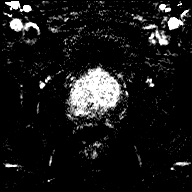
[im 309/580]
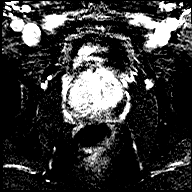
[im 348/580]
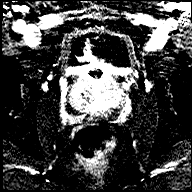
[im 387/580]
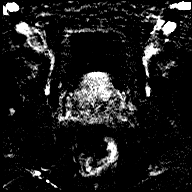
[im 425/580]
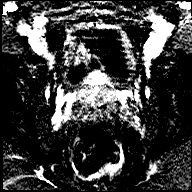
[im 464/580]
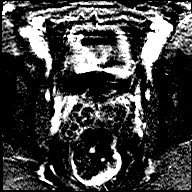
[im 502/580]
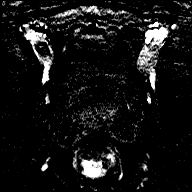
[im 541/580]
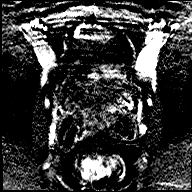
[im 580/580]
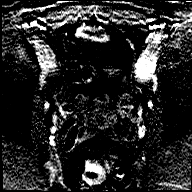

[Series 13: t1_vibe_dixon_tra_f · axial · 2.5mm · 0.91mm/px · z∈[-47,+150]mm · 2 of 80 slices shown]
[im 1/80]
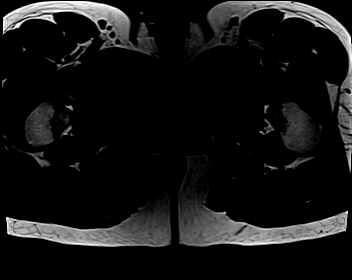
[im 80/80]
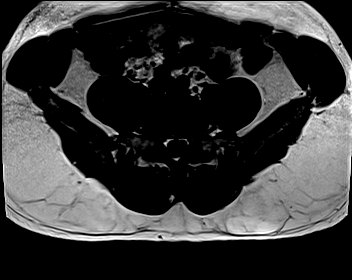

[Series 14: t1_vibe_dixon_tra_w · axial · 2.5mm · 0.91mm/px · z∈[-47,+150]mm · 2 of 80 slices shown]
[im 1/80]
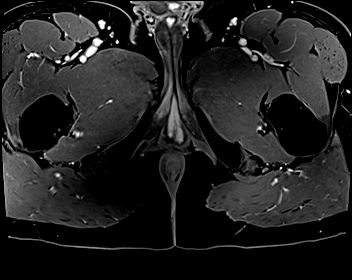
[im 80/80]
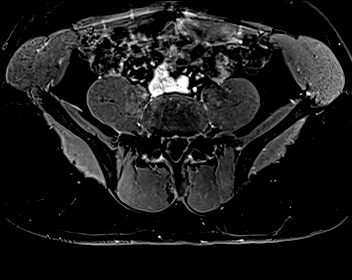

[48 of 48 positions shown; findings below may reference images not displayed]

FINDINGS: Prostate:

-- Peripheral Zone: No focal lesion seen on ADC or high b-value DWI
sequences.

-- Transition/Central Zone: Mildly enlarged with small circumscribed
BPH nodules noted; however, no suspicious nodules with
obscured/non-circumscribed margins or restricted diffusion seen.

-- Measurements/Volume:  4.3 x 4.5 x 5.4 cm (volume = 55 cm^3)

Transcapsular spread:  Absent

Seminal vesicle involvement:  Absent

Neurovascular bundle involvement:  Absent

Pelvic adenopathy: None visualized

Bone metastasis: None visualized

Other: Mild diffuse bladder wall thickening, consistent with chronic
bladder outlet obstruction.
IMPRESSION: No radiographic evidence of high-grade prostate carcinoma. PI-RADS
1: Very Low (clinically significant cancer is highly unlikely to be
present)

## 2022-11-10 ENCOUNTER — Ambulatory Visit: Payer: Managed Care, Other (non HMO) | Admitting: Podiatry

## 2022-11-10 ENCOUNTER — Ambulatory Visit (INDEPENDENT_AMBULATORY_CARE_PROVIDER_SITE_OTHER): Payer: Managed Care, Other (non HMO)

## 2022-11-10 VITALS — BP 145/79 | HR 50

## 2022-11-10 DIAGNOSIS — M205X2 Other deformities of toe(s) (acquired), left foot: Secondary | ICD-10-CM
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
## 2022-12-15 ENCOUNTER — Ambulatory Visit: Payer: Managed Care, Other (non HMO) | Admitting: Medical

## 2022-12-15 VITALS — BP 150/90 | HR 85 | Wt 234.4 lb

## 2022-12-15 DIAGNOSIS — E785 Hyperlipidemia, unspecified: Secondary | ICD-10-CM

## 2022-12-15 DIAGNOSIS — I10 Essential (primary) hypertension: Secondary | ICD-10-CM

## 2022-12-15 MED ORDER — VALSARTAN 40 MG PO TABS: 40.0000 mg | ORAL_TABLET | Freq: Every day | ORAL | 2 refills | Status: DC

## 2022-12-15 NOTE — Progress Notes (Signed)
Subjective:  Dale Good is a 57 y.o. male who presents for Chief Complaint  Patient presents with   BP issues    Having some elevated BP issues for the last month. BP has been 150's. Pt is not having any chest pains, or SOB, just feel drained all the time     Here today for concerns about blood pressure.  Last visit May 2023.  He has a history of hyperlipidemia, GERD, elevated uric acid, history of elevated PSA, BPH, allergies.  He notes recently went to dentist a month ago, BP was elevated then.  Bp was around 155 SBP that day.  Since then he started checking BPs, and he is seeing 140-150s, 70-80s DBP.  No chest pain, no palpitations, no edema.     Exercise - not a lot.  Has new schedule at work, hard to get exercise.   Hyperlipidemia-not currently taking cholesterol medication.  Takes it every now and then.    No other aggravating or relieving factors.    No other c/o.  Past Medical History:  Diagnosis Date   Allergy    Arthralgia    BPH (benign prostatic hypertrophy)    Elevated PSA 2018   2020 consult with urology, biopsies negative, c/t f/u with urology, Dr. Modena Slater   Elevated uric acid in blood 2020   Erectile dysfunction    Family history of prostate cancer    GERD (gastroesophageal reflux disease)    Hyperlipidemia    Normal cardiac stress test 2015   Dr. Viann Fish   Wears glasses    Current Outpatient Medications on File Prior to Visit  Medication Sig Dispense Refill   Multiple Vitamin (MULTIVITAMIN) tablet Take 1 tablet by mouth daily.     rosuvastatin (CRESTOR) 10 MG tablet Take 1 tablet (10 mg total) by mouth daily. (Patient not taking: Reported on 12/15/2022) 90 tablet 3   No current facility-administered medications on file prior to visit.   Family History  Problem Relation Age of Onset   Hypertension Mother    Kidney disease Mother        dialysis   Diabetes Mother        amputation   Hypertension Brother    Glaucoma Brother    Cancer  Father 14       prostate   Heart disease Neg Hx    Stroke Neg Hx    Colon cancer Neg Hx    Esophageal cancer Neg Hx    Rectal cancer Neg Hx    Stomach cancer Neg Hx      The following portions of the patient's history were reviewed and updated as appropriate: allergies, current medications, past family history, past medical history, past social history, past surgical history and problem list.  ROS Otherwise as in subjective above     Objective: BP (!) 150/90   Pulse 85   Wt 234 lb 6.4 oz (106.3 kg)   BMI 31.79 kg/m   BP Readings from Last 3 Encounters:  12/15/22 (!) 150/90  11/10/22 (!) 145/79  12/31/21 120/70   Wt Readings from Last 3 Encounters:  12/15/22 234 lb 6.4 oz (106.3 kg)  12/31/21 238 lb 3.2 oz (108 kg)  05/28/21 242 lb 9.6 oz (110 kg)    General appearance: alert, no distress, well developed, well nourished Neck: supple, no lymphadenopathy, no thyromegaly, no masses, no bruits Heart: RRR, normal S1, S2, no murmurs Lungs: CTA bilaterally, no wheezes, rhonchi, or rales Pulses: 2+ radial pulses, 2+  pedal pulses, normal cap refill Ext: no edema    Assessment: Encounter Diagnoses  Name Primary?   Essential hypertension, benign Yes   Hyperlipidemia, unspecified hyperlipidemia type      Plan: Hypertension - Discussed diagnosis, possible complications, treatment recommendations including regular aerobic exercise, low salt diet, dietary sodium restriction, working towards a health weight, and need for medications.  Begin medication Valsartan as below daily.  Discussed risks/benefits of medication. Check blood pressures  3 times weekly and record.  Follow up in 4-6 weeks.  Hyperlipidemia-not currently on medication.  Last year he had elevated liver test on statin but resolved after stopping statin.  He did not resume medication after that.  Plan to revisit lipids and liver tests soon at well visit.  Bran was seen today for bp issues.  Diagnoses and  all orders for this visit:  Essential hypertension, benign  Hyperlipidemia, unspecified hyperlipidemia type  Other orders -     valsartan (DIOVAN) 40 MG tablet; Take 1 tablet (40 mg total) by mouth daily.   Follow up: 18mo

## 2023-03-03 ENCOUNTER — Encounter: Payer: Self-pay | Admitting: Medical

## 2023-03-03 ENCOUNTER — Ambulatory Visit: Payer: Managed Care, Other (non HMO) | Admitting: Medical

## 2023-03-03 VITALS — BP 122/80 | HR 46 | Ht 72.0 in | Wt 227.4 lb

## 2023-03-03 DIAGNOSIS — Z0000 Encounter for general adult medical examination without abnormal findings: Secondary | ICD-10-CM | POA: Diagnosis not present

## 2023-03-03 DIAGNOSIS — E785 Hyperlipidemia, unspecified: Secondary | ICD-10-CM

## 2023-03-03 DIAGNOSIS — R7301 Impaired fasting glucose: Secondary | ICD-10-CM | POA: Diagnosis not present

## 2023-03-03 DIAGNOSIS — I10 Essential (primary) hypertension: Secondary | ICD-10-CM

## 2023-03-03 DIAGNOSIS — E790 Hyperuricemia without signs of inflammatory arthritis and tophaceous disease: Secondary | ICD-10-CM

## 2023-03-03 DIAGNOSIS — R001 Bradycardia, unspecified: Secondary | ICD-10-CM

## 2023-03-03 DIAGNOSIS — Z1211 Encounter for screening for malignant neoplasm of colon: Secondary | ICD-10-CM | POA: Insufficient documentation

## 2023-03-03 DIAGNOSIS — N400 Enlarged prostate without lower urinary tract symptoms: Secondary | ICD-10-CM | POA: Diagnosis not present

## 2023-03-03 DIAGNOSIS — Z131 Encounter for screening for diabetes mellitus: Secondary | ICD-10-CM
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
## 2023-03-04 ENCOUNTER — Other Ambulatory Visit: Payer: Self-pay | Admitting: Medical

## 2023-03-04 LAB — COMPREHENSIVE METABOLIC PANEL
ALT: 31 IU/L (ref 0–44)
AST: 24 IU/L (ref 0–40)
Albumin: 4.6 g/dL (ref 3.8–4.9)
Alkaline Phosphatase: 92 IU/L (ref 44–121)
BUN/Creatinine Ratio: 13 (ref 9–20)
BUN: 15 mg/dL (ref 6–24)
Bilirubin Total: 0.4 mg/dL (ref 0.0–1.2)
CO2: 25 mmol/L (ref 20–29)
Calcium: 9.9 mg/dL (ref 8.7–10.2)
Chloride: 104 mmol/L (ref 96–106)
Creatinine, Ser: 1.15 mg/dL (ref 0.76–1.27)
Globulin, Total: 2.6 g/dL (ref 1.5–4.5)
Glucose: 91 mg/dL (ref 70–99)
Potassium: 4.3 mmol/L (ref 3.5–5.2)
Sodium: 142 mmol/L (ref 134–144)
Total Protein: 7.2 g/dL (ref 6.0–8.5)
eGFR: 74 mL/min/{1.73_m2} (ref >59)

## 2023-03-04 LAB — CBC
Hematocrit: 45.3 % (ref 37.5–51.0)
Hemoglobin: 14.4 g/dL (ref 13.0–17.7)
MCH: 27.1 pg (ref 26.6–33.0)
MCHC: 31.8 g/dL (ref 31.5–35.7)
MCV: 85 fL (ref 79–97)
Platelets: 230 10*3/uL (ref 150–450)
RBC: 5.32 x10E6/uL (ref 4.14–5.80)
RDW: 14.4 % (ref 11.6–15.4)
WBC: 5.2 10*3/uL (ref 3.4–10.8)

## 2023-03-04 LAB — POCT URINALYSIS DIP (PROADVANTAGE DEVICE)
Bilirubin, UA: NEGATIVE
Blood, UA: NEGATIVE
Glucose, UA: NEGATIVE mg/dL
Ketones, POC UA: NEGATIVE mg/dL
Nitrite, UA: NEGATIVE
Protein Ur, POC: 30 mg/dL — AB
Specific Gravity, Urine: 1.03
Urobilinogen, Ur: NEGATIVE
pH, UA: 5 (ref 5.0–8.0)

## 2023-03-04 LAB — HEMOGLOBIN A1C
Est. average glucose Bld gHb Est-mCnc: 126 mg/dL
Hgb A1c MFr Bld: 6 % — ABNORMAL HIGH (ref 4.8–5.6)

## 2023-03-04 LAB — LIPID PANEL
Chol/HDL Ratio: 4.4 ratio (ref 0.0–5.0)
Cholesterol, Total: 213 mg/dL — ABNORMAL HIGH (ref 100–199)
HDL: 48 mg/dL (ref >39)
LDL Chol Calc (NIH): 149 mg/dL — ABNORMAL HIGH (ref 0–99)
Triglycerides: 91 mg/dL (ref 0–149)
VLDL Cholesterol Cal: 16 mg/dL (ref 5–40)

## 2023-03-04 LAB — URIC ACID: Uric Acid: 7.2 mg/dL (ref 3.8–8.4)

## 2023-03-04 LAB — TSH: TSH: 0.86 u[IU]/mL (ref 0.450–4.500)

## 2023-03-04 MED ORDER — VALSARTAN 40 MG PO TABS: 40.0000 mg | ORAL_TABLET | Freq: Every day | ORAL | 3 refills | Status: DC

## 2023-03-04 MED ORDER — ROSUVASTATIN CALCIUM 10 MG PO TABS: 10.0000 mg | ORAL_TABLET | Freq: Every day | ORAL | 2 refills | Status: DC

## 2023-03-04 NOTE — Progress Notes (Signed)
Results sent through MyChart

## 2023-03-07 NOTE — Progress Notes (Signed)
Please call as they have not reviewed my chart results

## 2023-03-30 ENCOUNTER — Ambulatory Visit: Payer: Managed Care, Other (non HMO) | Admitting: Podiatry

## 2023-05-04 ENCOUNTER — Ambulatory Visit (INDEPENDENT_AMBULATORY_CARE_PROVIDER_SITE_OTHER): Payer: Managed Care, Other (non HMO)

## 2023-05-04 ENCOUNTER — Ambulatory Visit: Payer: Managed Care, Other (non HMO) | Admitting: Podiatry

## 2023-05-04 DIAGNOSIS — M205X2 Other deformities of toe(s) (acquired), left foot: Secondary | ICD-10-CM
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
## 2023-06-05 ENCOUNTER — Emergency Department (HOSPITAL_COMMUNITY)
Admission: EM | Admit: 2023-06-05 | Discharge: 2023-06-06 | Disposition: A | Discharge status: Home / Self Care | Payer: Managed Care, Other (non HMO) | Attending: Emergency Medicine | Admitting: Emergency Medicine

## 2023-06-05 ENCOUNTER — Other Ambulatory Visit: Payer: Self-pay

## 2023-06-05 ENCOUNTER — Emergency Department (HOSPITAL_COMMUNITY): Payer: Managed Care, Other (non HMO)

## 2023-06-05 DIAGNOSIS — H9203 Otalgia, bilateral: Secondary | ICD-10-CM | POA: Insufficient documentation

## 2023-06-05 DIAGNOSIS — R509 Fever, unspecified: Secondary | ICD-10-CM | POA: Insufficient documentation

## 2023-06-05 LAB — CBC WITH DIFFERENTIAL/PLATELET
Abs Immature Granulocytes: 0.02 10*3/uL (ref 0.00–0.07)
Basophils Absolute: 0 10*3/uL (ref 0.0–0.1)
Basophils Relative: 0 %
Eosinophils Absolute: 0.2 10*3/uL (ref 0.0–0.5)
Eosinophils Relative: 2 %
HCT: 44.1 % (ref 39.0–52.0)
Hemoglobin: 14.5 g/dL (ref 13.0–17.0)
Immature Granulocytes: 0 %
Lymphocytes Relative: 17 %
Lymphs Abs: 1.4 10*3/uL (ref 0.7–4.0)
MCH: 28.3 pg (ref 26.0–34.0)
MCHC: 32.9 g/dL (ref 30.0–36.0)
MCV: 86 fL (ref 80.0–100.0)
Monocytes Absolute: 0.9 10*3/uL (ref 0.1–1.0)
Monocytes Relative: 11 %
Neutro Abs: 5.5 10*3/uL (ref 1.7–7.7)
Neutrophils Relative %: 70 %
Platelets: 207 10*3/uL (ref 150–400)
RBC: 5.13 MIL/uL (ref 4.22–5.81)
RDW: 13.7 % (ref 11.5–15.5)
WBC: 7.9 10*3/uL (ref 4.0–10.5)
nRBC: 0 % (ref 0.0–0.2)

## 2023-06-05 LAB — PROTIME-INR
INR: 1.1 (ref 0.8–1.2)
Prothrombin Time: 14.3 s (ref 11.4–15.2)

## 2023-06-05 LAB — APTT: aPTT: 35 s (ref 24–36)

## 2023-06-05 LAB — I-STAT CG4 LACTIC ACID, ED: Lactic Acid, Venous: 0.7 mmol/L (ref 0.5–1.9)

## 2023-06-05 MED ORDER — IBUPROFEN 800 MG PO TABS
800.0000 mg | ORAL_TABLET | Freq: Once | ORAL | Status: AC
  Administered 2023-06-06: 800 mg via ORAL
  Filled 2023-06-05 (×2): qty 1

## 2023-06-05 NOTE — ED Triage Notes (Signed)
Fever since Friday that has been increasing. Highest 104.65F  Endorses HA, bilateral eye/ear pain, fatigue.  Covid and flu at [facility] this AM negative. Offered repeat covid swab during MSE but declined  H/o HTN  Denies cough, urinary sx, abd pain, N/V  No known sick contacts

## 2023-06-05 NOTE — ED Provider Triage Note (Signed)
Emergency Medicine Provider Triage Evaluation Note  Dale Good , a 57 y.o. male  was evaluated in triage.  Pt complains of flu like sxs x 4 days. Persistent fever up to 104. Negative covid and flu earlier today..  Review of Systems  Positive: Flu like sxs Negative: Cough, diarrhea, rash  Physical Exam  BP 122/79 (BP Location: Left Arm)   Pulse 86   Temp (!) 101.9 F (38.8 C) (Oral)   Resp (!) 24   Wt 102.1 kg   SpO2 96%   BMI 30.52 kg/m  Gen:   Awake, no distress   Resp:  Normal effort  MSK:   Moves extremities without difficulty  Other:    Medical Decision Making  Medically screening exam initiated at 10:23 PM.  Appropriate orders placed.  Dale Good was informed that the remainder of the evaluation will be completed by another provider, this initial triage assessment does not replace that evaluation, and the importance of remaining in the ED until their evaluation is complete.    POC Influenza A&B NAT (IDNOW) (06/05/2023 9:08 AM EDT) Lab Results - POC Influenza A&B NAT (IDNOW) (06/05/2023 9:08 AM EDT) Component Value Ref Range Test Method Analysis Time Performed At Pathologist Signature  Influenza A RNA Negative Negative     [facility](CLIA#[id number])    Influenza B RNA Negative Negative     [facility](CLIA#[id number])       Dale Captain, PA-C 06/05/23 2228

## 2023-06-06 LAB — URINALYSIS, W/ REFLEX TO CULTURE (INFECTION SUSPECTED)
Bacteria, UA: NONE SEEN
Bilirubin Urine: NEGATIVE
Glucose, UA: NEGATIVE mg/dL
Hgb urine dipstick: NEGATIVE
Ketones, ur: NEGATIVE mg/dL
Leukocytes,Ua: NEGATIVE
Nitrite: NEGATIVE
Protein, ur: NEGATIVE mg/dL
Specific Gravity, Urine: 1.03 (ref 1.005–1.030)
pH: 5 (ref 5.0–8.0)

## 2023-06-06 LAB — COMPREHENSIVE METABOLIC PANEL
ALT: 45 U/L — ABNORMAL HIGH (ref 0–44)
AST: 32 U/L (ref 15–41)
Albumin: 3.9 g/dL (ref 3.5–5.0)
Alkaline Phosphatase: 75 U/L (ref 38–126)
Anion gap: 10 (ref 5–15)
BUN: 17 mg/dL (ref 6–20)
CO2: 21 mmol/L — ABNORMAL LOW (ref 22–32)
Calcium: 8.6 mg/dL — ABNORMAL LOW (ref 8.9–10.3)
Chloride: 105 mmol/L (ref 98–111)
Creatinine, Ser: 1.23 mg/dL (ref 0.61–1.24)
GFR, Estimated: >60 mL/min (ref >60)
Glucose, Bld: 124 mg/dL — ABNORMAL HIGH (ref 70–99)
Potassium: 3.9 mmol/L (ref 3.5–5.1)
Sodium: 136 mmol/L (ref 135–145)
Total Bilirubin: 0.6 mg/dL (ref 0.3–1.2)
Total Protein: 7.7 g/dL (ref 6.5–8.1)

## 2023-06-06 MED ORDER — IBUPROFEN 600 MG PO TABS
600.0000 mg | ORAL_TABLET | Freq: Three times a day (TID) | ORAL | 0 refills | Status: AC | PRN
Start: 2023-06-06 — End: ?

## 2023-06-06 NOTE — ED Provider Notes (Signed)
[name] Provider Note   CSN: [id number] Arrival date & time: 06/05/23  2152     History  Chief Complaint  Patient presents with   Fever    Dale Good is a 57 y.o. male.  The history is provided by the patient.  Fever Max temp prior to arrival:  104 Severity:  Moderate Onset quality:  Gradual Duration:  4 days Progression:  Waxing and waning Chronicity:  New Relieved by:  Nothing Worsened by:  Nothing Ineffective treatments:  Ibuprofen Associated symptoms: ear pain   Associated symptoms: no chest pain, no confusion, no congestion, no cough, no diarrhea, no dysuria, no headaches, no nausea, no rash, no rhinorrhea, no somnolence, no sore throat and no vomiting   Associated symptoms comment:  Bilateral ear pain  Risk factors: no occupational exposure and no recent sickness   Patient with benign prostatic hypertrophy presents with fever for 4 days.  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  He has associated bilateral ear pain.  No congestion, sore throat, rashes.  No nausea vomiting or diarrhea.  No cough.  No sore throat.      Past Medical History:  Diagnosis Date   Allergy    Arthralgia    BPH (benign prostatic hypertrophy)    Elevated PSA 2018   2020 consult with urology, biopsies negative, c/t f/u with urology, Dr. Modena Slater   Elevated uric acid in blood 2020   Erectile dysfunction    Family history of prostate cancer    GERD (gastroesophageal reflux disease)    Hyperlipidemia    Normal cardiac stress test 2015   Dr. Viann Fish   Wears glasses      Home Medications Prior to Admission medications   Medication Sig Start Date End Date Taking? Authorizing Provider  ibuprofen (ADVIL) 600 MG tablet Take 1 tablet (600 mg total) by mouth every 8 (eight) hours as needed. 06/06/23  Yes Sorin Frimpong, MD  Multiple Vitamin (MULTIVITAMIN) tablet Take 1 tablet by mouth daily.    [provider]  rosuvastatin (CRESTOR) 10 MG tablet Take 1 tablet (10 mg total) by mouth daily. 03/04/23 03/03/24  Tysinger, Kermit Balo, PA-C  tamsulosin (FLOMAX) 0.4 MG CAPS capsule Take 0.4 mg by mouth.    [provider]  valsartan (DIOVAN) 40 MG tablet Take 1 tablet (40 mg total) by mouth daily. 03/04/23   Tysinger, Kermit Balo, PA-C      Allergies    Influenza vaccines and Penicillins    Review of Systems   Review of Systems  Constitutional:  Positive for fever.  HENT:  Positive for ear pain. Negative for congestion, rhinorrhea and sore throat.   Eyes:  Negative for photophobia.  Respiratory:  Negative for cough.   Cardiovascular:  Negative for chest pain.  Gastrointestinal:  Negative for diarrhea, nausea and vomiting.  Genitourinary:  Negative for dysuria.  Skin:  Negative for rash.  Neurological:  Negative for headaches.  Psychiatric/Behavioral:  Negative for confusion.   All other systems reviewed and are negative.   Physical Exam Updated Vital Signs BP (!) 155/77   Pulse 66   Temp 100.3 F (37.9 C) (Oral)   Resp (!) 21   Wt 102.1 kg   SpO2 99%   BMI 30.52 kg/m  Physical Exam Vitals and nursing note reviewed.  Constitutional:      General: He is not in acute distress.    Appearance: Normal appearance. He  is well-developed. He is not ill-appearing or diaphoretic.  HENT:     Head: Normocephalic and atraumatic.     Right Ear: Tympanic membrane, ear canal and external ear normal.     Left Ear: Tympanic membrane, ear canal and external ear normal.     Nose: Nose normal.     Mouth/Throat:     Mouth: Mucous membranes are moist.     Pharynx: Oropharynx is clear. No oropharyngeal exudate.  Eyes:     Conjunctiva/sclera: Conjunctivae normal.     Pupils: Pupils are equal, round, and reactive to light.  Cardiovascular:     Rate and Rhythm: Normal rate and regular rhythm.  Pulmonary:     Effort: Pulmonary effort is normal.     Breath sounds: Normal breath sounds. No wheezing or  rales.  Abdominal:     General: Bowel sounds are normal. There is no distension.     Palpations: Abdomen is soft.     Tenderness: There is no abdominal tenderness. There is no guarding or rebound.     Hernia: No hernia is present.  Musculoskeletal:        General: No tenderness. Normal range of motion.     Cervical back: Normal range of motion and neck supple. No rigidity or tenderness.     Right lower leg: No edema.     Left lower leg: No edema.     Right ankle: Normal.     Right Achilles Tendon: Normal.     Left ankle: Normal.     Left Achilles Tendon: Normal.     Right foot: Normal. Normal range of motion and normal capillary refill. No crepitus. Normal pulse.     Left foot: Normal. Normal range of motion and normal capillary refill. No crepitus. Normal pulse.  Lymphadenopathy:     Head:     Right side of head: No tonsillar, preauricular, posterior auricular or occipital adenopathy.     Left side of head: No tonsillar, preauricular, posterior auricular or occipital adenopathy.     Cervical: No cervical adenopathy.     Right cervical: No superficial, deep or posterior cervical adenopathy.    Left cervical: No superficial, deep or posterior cervical adenopathy.     Upper Body:     Right upper body: No supraclavicular, axillary or epitrochlear adenopathy.     Left upper body: No supraclavicular, axillary or epitrochlear adenopathy.  Skin:    General: Skin is warm and dry.     Capillary Refill: Capillary refill takes less than 2 seconds.     Coloration: Skin is not ashen, cyanotic, jaundiced, mottled or pale.     Findings: No bruising, ecchymosis, erythema, lesion, petechiae or rash.  Neurological:     General: No focal deficit present.     Mental Status: He is alert and oriented to person, place, and time.     Deep Tendon Reflexes: Reflexes normal.  Psychiatric:        Thought Content: Thought content normal.     ED Results / Procedures / Treatments   Labs (all labs ordered  are listed, but only abnormal results are displayed) Results for orders placed or performed during the hospital encounter of 06/05/23  Comprehensive metabolic panel  Result Value Ref Range   Sodium 136 135 - 145 mmol/L   Potassium 3.9 3.5 - 5.1 mmol/L   Chloride 105 98 - 111 mmol/L   CO2 21 (L) 22 - 32 mmol/L   Glucose, Bld 124 (H) 70 - 99 mg/dL  BUN 17 6 - 20 mg/dL   Creatinine, Ser 1.61 0.61 - 1.24 mg/dL   Calcium 8.6 (L) 8.9 - 10.3 mg/dL   Total Protein 7.7 6.5 - 8.1 g/dL   Albumin 3.9 3.5 - 5.0 g/dL   AST 32 15 - 41 U/L   ALT 45 (H) 0 - 44 U/L   Alkaline Phosphatase 75 38 - 126 U/L   Total Bilirubin 0.6 0.3 - 1.2 mg/dL   GFR, Estimated >09 >60 mL/min   Anion gap 10 5 - 15  CBC with Differential  Result Value Ref Range   WBC 7.9 4.0 - 10.5 K/uL   RBC 5.13 4.22 - 5.81 MIL/uL   Hemoglobin 14.5 13.0 - 17.0 g/dL   HCT 45.4 09.8 - 11.9 %   MCV 86.0 80.0 - 100.0 fL   MCH 28.3 26.0 - 34.0 pg   MCHC 32.9 30.0 - 36.0 g/dL   RDW 14.7 82.9 - 56.2 %   Platelets 207 150 - 400 K/uL   nRBC 0.0 0.0 - 0.2 %   Neutrophils Relative % 70 %   Neutro Abs 5.5 1.7 - 7.7 K/uL   Lymphocytes Relative 17 %   Lymphs Abs 1.4 0.7 - 4.0 K/uL   Monocytes Relative 11 %   Monocytes Absolute 0.9 0.1 - 1.0 K/uL   Eosinophils Relative 2 %   Eosinophils Absolute 0.2 0.0 - 0.5 K/uL   Basophils Relative 0 %   Basophils Absolute 0.0 0.0 - 0.1 K/uL   Immature Granulocytes 0 %   Abs Immature Granulocytes 0.02 0.00 - 0.07 K/uL  Protime-INR  Result Value Ref Range   Prothrombin Time 14.3 11.4 - 15.2 seconds   INR 1.1 0.8 - 1.2  APTT  Result Value Ref Range   aPTT 35 24 - 36 seconds  Urinalysis, w/ Reflex to Culture (Infection Suspected) -Urine, Clean Catch  Result Value Ref Range   Specimen Source URINE, CLEAN CATCH    Color, Urine YELLOW YELLOW   APPearance CLEAR CLEAR   Specific Gravity, Urine 1.030 1.005 - 1.030   pH 5.0 5.0 - 8.0   Glucose, UA NEGATIVE NEGATIVE mg/dL   Hgb urine dipstick  NEGATIVE NEGATIVE   Bilirubin Urine NEGATIVE NEGATIVE   Ketones, ur NEGATIVE NEGATIVE mg/dL   Protein, ur NEGATIVE NEGATIVE mg/dL   Nitrite NEGATIVE NEGATIVE   Leukocytes,Ua NEGATIVE NEGATIVE   RBC / HPF 0-5 0 - 5 RBC/hpf   WBC, UA 0-5 0 - 5 WBC/hpf   Bacteria, UA NONE SEEN NONE SEEN   Squamous Epithelial / HPF 0-5 0 - 5 /HPF   Mucus PRESENT   I-Stat Lactic Acid, ED  Result Value Ref Range   Lactic Acid, Venous 0.7 0.5 - 1.9 mmol/L   DG Chest 2 View  Result Date: 06/05/2023 CLINICAL DATA:  Possible sepsis and fevers, initial encounter EXAM: CHEST - 2 VIEW COMPARISON:  None Available. FINDINGS: The heart size and mediastinal contours are within normal limits. Both lungs are clear. The visualized skeletal structures are unremarkable. IMPRESSION: No active cardiopulmonary disease. Electronically Signed   By: Alcide Clever M.D.   On: 06/05/2023 23:11   DG Foot Complete Left  Result Date: 05/11/2023 Please see detailed radiograph report in office note.   Radiology DG Chest 2 View  Result Date: 06/05/2023 CLINICAL DATA:  Possible sepsis and fevers, initial encounter EXAM: CHEST - 2 VIEW COMPARISON:  None Available. FINDINGS: The heart size and mediastinal contours are within normal limits. Both lungs are clear. The  visualized skeletal structures are unremarkable. IMPRESSION: No active cardiopulmonary disease. Electronically Signed   By: Alcide Clever M.D.   On: 06/05/2023 23:11    Procedures Procedures    Medications Ordered in ED Medications  ibuprofen (ADVIL) tablet 800 mg (0 mg Oral Hold 06/05/23 2301)    ED Course/ Medical Decision Making/ A&P                                 Medical Decision Making Patient with 4 days of fever and bilateral ear pain  Amount and/or Complexity of Data Reviewed Independent Historian: spouse    Details: See above  External Data Reviewed: labs and notes.    Details: Previous outpatient notes and covid test reviewed  Labs: ordered.     Details: Urine is negative for UTI.  Normal sodium 136, normal potassium 3.9, normal creatinine 1.23, normal liver function panel.  Normal white count 7.9, normal hemoglobin 14.5, normal platelet count.  Normal PT 14.3, normal INR 1.1.  lactic acid is normal 0.7.  Blood cultures sent and pending  Radiology: ordered and independent interpretation performed.    Details: No pneumonia on CXR by me   Risk Prescription drug management. Risk Details: Patient is not septic based on vital signs, exam, labs or imaging.  Lactate is normal 0.7.  Exam is normal and reassuring.  Differential includes: indolent infection, blood cultures have been sent and patient will be called if these are positive. Rheumatologic conditions can often cause fevers. I have advised close follow up with PMD for ongoing fever for further work up.  Viral infection, and while the patient does not recall being exposed to anyone per se he has grandchildren who visit regularly.  Without cough or positive chest Xray this is unlikely to be pneumonia, pertussis or mycoplasma.  Alternate tylenol and ibuprofen every 4 hours for fever control.  Drink copious oral liquids.  Work note provided so patient may rest at home.  Stable for discharge.  Strict return precautions.  All questions answered to patient and wife's satisfaction.      Final Clinical Impression(s) / ED Diagnoses Final diagnoses:  Otalgia of both ears  Febrile illness   I have reviewed the triage vital signs and the nursing notes. Pertinent labs & imaging results that were available during my care of the patient were reviewed by me and considered in my medical decision making (see chart for details). After history, exam, and medical workup I feel the patient has been appropriately medically screened and is safe for discharge home. Pertinent diagnoses were discussed with the patient. Patient was given return precautions.  Rx / DC Orders ED Discharge Orders          Ordered     ibuprofen (ADVIL) 600 MG tablet  Every 8 hours PRN        06/06/23 0634              Alphus Zeck, MD 06/06/23 5400

## 2023-06-06 NOTE — ED Notes (Signed)
Spoke to patient about need for urine sample, unable to provide at this time

## 2023-06-07 ENCOUNTER — Emergency Department (HOSPITAL_COMMUNITY): Payer: Managed Care, Other (non HMO)

## 2023-06-07 ENCOUNTER — Encounter (HOSPITAL_COMMUNITY): Payer: Self-pay | Admitting: Emergency Medicine

## 2023-06-07 ENCOUNTER — Emergency Department (HOSPITAL_COMMUNITY)
Admission: EM | Admit: 2023-06-07 | Discharge: 2023-06-07 | Disposition: A | Discharge status: Home / Self Care | Payer: Managed Care, Other (non HMO) | Attending: Emergency Medicine | Admitting: Emergency Medicine

## 2023-06-07 DIAGNOSIS — R509 Fever, unspecified: Secondary | ICD-10-CM

## 2023-06-07 DIAGNOSIS — R799 Abnormal finding of blood chemistry, unspecified: Secondary | ICD-10-CM

## 2023-06-07 DIAGNOSIS — B349 Viral infection, unspecified: Secondary | ICD-10-CM | POA: Diagnosis not present

## 2023-06-07 LAB — BLOOD CULTURE ID PANEL (REFLEXED) - BCID2

## 2023-06-07 NOTE — ED Notes (Signed)
Lab called 06/07/2023 at 8:51am and stated that patient had 1 positive aerobic gram positive cocci growing staph epi. ED Dr. Freida Busman stated for patient to come back for further evaluation. I spoke to patient and told him to please come back. He said he was coming.

## 2023-06-07 NOTE — ED Triage Notes (Signed)
Patient arrives ambulatory by POV with spouse states they were called and told to return here due to positive blood cultures. Patient denies any complaints at this time. Spouse states patients temperature has improved.

## 2023-06-07 NOTE — Discharge Instructions (Addendum)
It was our pleasure to provide your ER care today - we hope that you feel better.  Drink plenty of fluids/stay well hydrated.   Follow up with primary care doctor in one week if symptoms fail to improve/resolve.  Return to ER if symptoms worse, new symptoms, new/severe pain, chest pain, trouble breathing, abdominal pain, weak/faint, or other concern.

## 2023-06-07 NOTE — ED Provider Notes (Signed)
[place] EMERGENCY DEPARTMENT AT [facility] Provider Note   CSN: [id number] Arrival date & time: 06/07/23  8841     History  Chief Complaint  Patient presents with   Positive Blood Cultures    Dale Good is a 57 y.o. male.  Pt was recently in hospital with febrile illness - pt had general achinessody aches, fever, eye/ear/head discomfort, and was evaluated by PCP - covid and flu were negative. ED eval with normal cxr, normal wbc, normal lactate - did have blood cultures as part of that workup, and was called today and instructed to return to ED as a blood culture was positive (although culture shows staph epi, common skin contaminant). Pt indicates otherwise feels much improved. No fever/chills/sweats. No headache. No neck pain. No sore throat or cough. No chest pain or sob. No abd pain or nvd. No neck/back pain. No dysuria or gu c/o. No skin lesions, sores or rash.  The history is provided by the patient, medical records and the spouse.       Home Medications Prior to Admission medications   Medication Sig Start Date End Date Taking? Authorizing Provider  ibuprofen (ADVIL) 600 MG tablet Take 1 tablet (600 mg total) by mouth every 8 (eight) hours as needed. 06/06/23   Palumbo, April, MD  Multiple Vitamin (MULTIVITAMIN) tablet Take 1 tablet by mouth daily.    [provider]  rosuvastatin (CRESTOR) 10 MG tablet Take 1 tablet (10 mg total) by mouth daily. 03/04/23 03/03/24  Tysinger, Kermit Balo, PA-C  tamsulosin (FLOMAX) 0.4 MG CAPS capsule Take 0.4 mg by mouth.    [provider]  valsartan (DIOVAN) 40 MG tablet Take 1 tablet (40 mg total) by mouth daily. 03/04/23   Tysinger, Kermit Balo, PA-C      Allergies    Influenza vaccines and Penicillins    Review of Systems   Review of Systems  Constitutional:  Negative for chills and diaphoresis.  HENT:  Negative for sinus pressure and sore throat.   Eyes:  Negative for pain and redness.  Respiratory:   Negative for cough and shortness of breath.   Cardiovascular:  Negative for chest pain and leg swelling.  Gastrointestinal:  Negative for abdominal pain, diarrhea and vomiting.  Genitourinary:  Negative for dysuria.  Musculoskeletal:  Negative for back pain, neck pain and neck stiffness.  Neurological:  Negative for headaches.  Hematological:  Negative for adenopathy.    Physical Exam Updated Vital Signs BP (!) 145/73 (BP Location: Right Arm)   Pulse 66   Temp 98.9 F (37.2 C) (Oral)   Resp 16   Ht 1.829 m (6')   Wt 102.1 kg   SpO2 98%   BMI 30.52 kg/m  Physical Exam Vitals and nursing note reviewed.  Constitutional:      Appearance: Normal appearance. He is well-developed.  HENT:     Head: Atraumatic.     Right Ear: Tympanic membrane and ear canal normal.     Left Ear: Tympanic membrane and ear canal normal.     Nose: Nose normal.     Mouth/Throat:     Mouth: Mucous membranes are moist.     Pharynx: Oropharynx is clear.  Eyes:     General: No scleral icterus.    Conjunctiva/sclera: Conjunctivae normal.     Pupils: Pupils are equal, round, and reactive to light.  Neck:     Trachea: No tracheal deviation.     Comments: No stiffness or rigidity.  Cardiovascular:  Rate and Rhythm: Normal rate and regular rhythm.     Pulses: Normal pulses.     Heart sounds: Normal heart sounds. No murmur heard.    No friction rub. No gallop.  Pulmonary:     Effort: Pulmonary effort is normal. No accessory muscle usage or respiratory distress.     Breath sounds: Normal breath sounds.  Abdominal:     General: There is no distension.     Palpations: Abdomen is soft.     Tenderness: There is no abdominal tenderness.  Genitourinary:    Comments: No cva tenderness. Musculoskeletal:        General: No swelling.     Cervical back: Normal range of motion and neck supple. No rigidity.     Comments: CTLS spine, non tender, aligned, no step off. No focal extremity pain or swelling.    Skin:    General: Skin is warm and dry.     Findings: No rash.  Neurological:     Mental Status: He is alert.     Comments: Alert, speech clear. Motor/sens grossly intact. Steady gait.   Psychiatric:        Mood and Affect: Mood normal.     ED Results / Procedures / Treatments   Labs (all labs ordered are listed, but only abnormal results are displayed) Labs Reviewed - No data to display  EKG None  Radiology DG Chest 2 View  Result Date: 06/05/2023 CLINICAL DATA:  Possible sepsis and fevers, initial encounter EXAM: CHEST - 2 VIEW COMPARISON:  None Available. FINDINGS: The heart size and mediastinal contours are within normal limits. Both lungs are clear. The visualized skeletal structures are unremarkable. IMPRESSION: No active cardiopulmonary disease. Electronically Signed   By: Alcide Clever M.D.   On: 06/05/2023 23:11    Procedures Procedures    Medications Ordered in ED Medications - No data to display  ED Course/ Medical Decision Making/ A&P                                 Medical Decision Making Problems Addressed: Acute febrile illness: acute illness or injury with systemic symptoms that poses a threat to life or bodily functions Contamination of blood culture: acute illness or injury Viral syndrome: acute illness or injury with systemic symptoms  Amount and/or Complexity of Data Reviewed Independent Historian: spouse    Details: hx External Data Reviewed: notes. Labs:  Decision-making details documented in ED Course. Radiology: independent interpretation performed. Decision-making details documented in ED Course.   Reviewed nursing notes and prior charts for additional history.   Recent labs reviewed/interpreted by me  - covid/flu neg, wbc normal, lactate normal, chem unremarkable, ua neg for uti. Recent cxr reviewed/interpreted by me - no pna. Cx with staph epi - felt to be skin contaminant, pt afebrile, no fever/chills/sweats, earlier symptoms resolved  (felt most c/w viral syndrome).   Pt currently appears stable for d/c.   Return precautions provided.         Final Clinical Impression(s) / ED Diagnoses Final diagnoses:  None    Rx / DC Orders ED Discharge Orders     None         Cathren Laine, MD 06/07/23 1115

## 2023-06-08 LAB — CULTURE, BLOOD (ROUTINE X 2): Special Requests: ADEQUATE

## 2023-06-09 ENCOUNTER — Ambulatory Visit: Payer: Managed Care, Other (non HMO) | Admitting: Medical

## 2023-06-09 VITALS — BP 120/68 | HR 49 | Wt 235.0 lb

## 2023-06-09 DIAGNOSIS — B349 Viral infection, unspecified: Secondary | ICD-10-CM | POA: Diagnosis not present

## 2023-06-09 DIAGNOSIS — R509 Fever, unspecified: Secondary | ICD-10-CM

## 2023-06-09 NOTE — Progress Notes (Signed)
Subjective:  Dale Good is a 57 y.o. male who presents for Chief Complaint  Patient presents with   Hospitalization Follow-up    Hospital follow-up. Had positive blood cultures and just needs some clarifciation     Here for hospital follow-up.  Accompanied by his wife today.  He started getting sick about a week ago on Thursday.  Initial symptoms included fever, ear pain, eye pain, HA,chills, sweats.  By Sunday that weekend he felt much worse.  He went to the urgent care Sunday morning, negative for flu and COVID.  Advised he probably had a respiratory virus.  By that night though his fever was over 104 and he felt awful.  He went to the emergency department had several tests done.  He had a chest x-ray as well.  No specific thing found so he was advised likely virus, advised rest, hydration and anti-inflammatory which he used.  He did anti-inflammatory Motrin every 4 hours for about 5 days.  Today he feels 80% improved.  He has a slight cough but all the other symptoms pretty much resolved  No other aggravating or relieving factors.    No other c/o.  The following portions of the patient's history were reviewed and updated as appropriate: allergies, current medications, past family history, past medical history, past social history, past surgical history and problem list.  Review of Systems  Constitutional:  Negative for chills, fever, malaise/fatigue and weight loss.  HENT:  Negative for congestion, ear pain, sinus pain, sore throat and tinnitus.   Eyes:  Negative for blurred vision, pain and redness.  Respiratory:  Positive for cough. Negative for hemoptysis and shortness of breath.   Cardiovascular:  Negative for chest pain.  Gastrointestinal:  Negative for abdominal pain, blood in stool, diarrhea, nausea and vomiting.  Genitourinary:  Negative for dysuria, flank pain, frequency, hematuria and urgency.  Musculoskeletal:  Negative for back pain, joint pain, myalgias and neck  pain.  Skin:  Negative for itching and rash.  Neurological:  Negative for dizziness, tingling, speech change, weakness and headaches.  Endo/Heme/Allergies:  Negative for polydipsia.     Past Medical History:  Diagnosis Date   Allergy    Arthralgia    BPH (benign prostatic hypertrophy)    Elevated PSA 2018   2020 consult with urology, biopsies negative, c/t f/u with urology, Dr. Modena Slater   Elevated uric acid in blood 2020   Erectile dysfunction    Family history of prostate cancer    GERD (gastroesophageal reflux disease)    Hyperlipidemia    Normal cardiac stress test 2015   Dr. Viann Fish   Wears glasses    Current Outpatient Medications on File Prior to Visit  Medication Sig Dispense Refill   ibuprofen (ADVIL) 600 MG tablet Take 1 tablet (600 mg total) by mouth every 8 (eight) hours as needed. 21 tablet 0   Multiple Vitamin (MULTIVITAMIN) tablet Take 1 tablet by mouth daily.     pravastatin (PRAVACHOL) 20 MG tablet Take 20 mg by mouth daily.     tamsulosin (FLOMAX) 0.4 MG CAPS capsule Take 0.4 mg by mouth.     valsartan (DIOVAN) 40 MG tablet Take 1 tablet (40 mg total) by mouth daily. 90 tablet 3   No current facility-administered medications on file prior to visit.     Objective: BP 120/68   Pulse (!) 49   Wt 235 lb (106.6 kg)   BMI 31.87 kg/m   Wt Readings from Last 3 Encounters:  06/09/23 235 lb (106.6 kg)  06/07/23 225 lb (102.1 kg)  06/05/23 225 lb (102.1 kg)    General appearance: alert, no distress, well developed, well nourished HEENT: normocephalic, sclerae anicteric, conjunctiva pink and moist, TMs pearly, nares patent, no discharge or erythema, pharynx normal Oral cavity: somewhat dryMM, no lesions Neck: supple, no lymphadenopathy, no thyromegaly, no masses Heart: RRR, normal S1, S2, no murmurs Lungs: CTA bilaterally, no wheezes, rhonchi, or rales Abdomen: +bs, soft, non tender, non distended, no masses, no hepatomegaly, no  splenomegaly Pulses: 2+ radial pulses, 2+ pedal pulses, normal cap refill Ext: no edema   Assessment: Encounter Diagnoses  Name Primary?   Fever, unspecified fever cause Yes   Viral syndrome      Plan: I reviewed his recent hospital evaluation in the emergency department, labs, chest x-ray and blood culture with likely contaminant.  Given that he is 80+ percent improved and looks pretty good today clinically without any significant symptoms today, likely had a viral syndrome  Advised he continue to work on hydration as he is a little bit dry today in the mucous membranes.  Relative rest advised.  Motrin as needed.  Advise if any new or worsening symptoms over the next few weeks such as fevers, night sweats, worsening cough, decreased appetite, lymph node/swollen gland enlargement, or other new symptoms to call back or recheck we will do some additional evaluation  But currently he appears to be moving towards resolution of his symptoms  They agreed with the recommendations and plan  Leonell was seen today for hospitalization follow-up.  Diagnoses and all orders for this visit:  Fever, unspecified fever cause  Viral syndrome    Follow up: prn

## 2023-06-17 ENCOUNTER — Other Ambulatory Visit: Payer: Self-pay | Admitting: Medical

## 2023-06-17 NOTE — Telephone Encounter (Signed)
Spoke to [employer], she located rx for Diovan from 02/2023 # 90  and she ran thru insurance and will get ready for pt

## 2023-07-04 ENCOUNTER — Encounter: Payer: Self-pay | Admitting: Gastroenterology

## 2024-01-02 ENCOUNTER — Other Ambulatory Visit: Payer: Self-pay | Admitting: Urology

## 2024-01-02 DIAGNOSIS — R972 Elevated prostate specific antigen [PSA]: Secondary | ICD-10-CM

## 2024-01-18 ENCOUNTER — Encounter: Payer: Self-pay | Admitting: Urology

## 2024-02-15 ENCOUNTER — Other Ambulatory Visit

## 2024-02-16 ENCOUNTER — Ambulatory Visit
Admission: RE | Admit: 2024-02-16 | Discharge: 2024-02-16 | Disposition: A | Discharge status: Home / Self Care | Source: Ambulatory Visit | Attending: Urology | Admitting: Urology

## 2024-02-16 DIAGNOSIS — R972 Elevated prostate specific antigen [PSA]: Secondary | ICD-10-CM

## 2024-02-16 MED ORDER — GADOPICLENOL 0.5 MMOL/ML IV SOLN
10.0000 mL | Freq: Once | INTRAVENOUS | Status: AC | PRN
Start: 2024-02-16 — End: 2024-02-16
  Administered 2024-02-16: 10 mL via INTRAVENOUS

## 2024-03-15 ENCOUNTER — Encounter: Payer: Self-pay | Admitting: Medical

## 2024-03-15 ENCOUNTER — Ambulatory Visit: Payer: Managed Care, Other (non HMO) | Admitting: Medical

## 2024-03-15 VITALS — BP 118/64 | HR 45 | Ht 73.0 in | Wt 219.8 lb

## 2024-03-15 DIAGNOSIS — R7301 Impaired fasting glucose: Secondary | ICD-10-CM

## 2024-03-15 DIAGNOSIS — N400 Enlarged prostate without lower urinary tract symptoms: Secondary | ICD-10-CM

## 2024-03-15 DIAGNOSIS — E785 Hyperlipidemia, unspecified: Secondary | ICD-10-CM

## 2024-03-15 DIAGNOSIS — Z0000 Encounter for general adult medical examination without abnormal findings: Secondary | ICD-10-CM | POA: Diagnosis not present

## 2024-03-15 DIAGNOSIS — R972 Elevated prostate specific antigen [PSA]: Secondary | ICD-10-CM

## 2024-03-15 DIAGNOSIS — I10 Essential (primary) hypertension: Secondary | ICD-10-CM | POA: Diagnosis not present

## 2024-03-15 LAB — LIPID PANEL

## 2024-03-15 NOTE — Patient Instructions (Signed)
 Call and schedule colonsocpy with Dr. Legrand office  [address] [place] [address], [place], [place] [address] Phone: [phone]
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
## 2024-03-16 ENCOUNTER — Ambulatory Visit: Payer: Self-pay | Admitting: Medical

## 2024-03-16 ENCOUNTER — Other Ambulatory Visit: Payer: Self-pay | Admitting: Medical

## 2024-03-16 LAB — LIPID PANEL
Cholesterol, Total: 222 mg/dL — AB (ref 100–199)
HDL: 46 mg/dL (ref >39)
LDL CALC COMMENT:: 4.8 ratio (ref 0.0–5.0)
LDL Chol Calc (NIH): 160 mg/dL — AB (ref 0–99)
Triglycerides: 89 mg/dL (ref 0–149)
VLDL Cholesterol Cal: 16 mg/dL (ref 5–40)

## 2024-03-16 LAB — CBC
Hematocrit: 45.3 % (ref 37.5–51.0)
Hemoglobin: 14.3 g/dL (ref 13.0–17.7)
MCH: 28.2 pg (ref 26.6–33.0)
MCHC: 31.6 g/dL (ref 31.5–35.7)
MCV: 89 fL (ref 79–97)
Platelets: 222 x10E3/uL (ref 150–450)
RBC: 5.07 x10E6/uL (ref 4.14–5.80)
RDW: 14.6 % (ref 11.6–15.4)
WBC: 4 x10E3/uL (ref 3.4–10.8)

## 2024-03-16 LAB — HEMOGLOBIN A1C
Est. average glucose Bld gHb Est-mCnc: 117 mg/dL
Hgb A1c MFr Bld: 5.7 — AB (ref 4.8–5.6)

## 2024-03-16 LAB — COMPREHENSIVE METABOLIC PANEL WITH GFR
ALT: 34 IU/L (ref 0–44)
AST: 29 IU/L (ref 0–40)
Albumin: 4.5 g/dL (ref 3.8–4.9)
Alkaline Phosphatase: 82 IU/L (ref 44–121)
BUN/Creatinine Ratio: 14 (ref 9–20)
BUN: 16 mg/dL (ref 6–24)
Bilirubin Total: 0.3 mg/dL (ref 0.0–1.2)
CO2: 23 mmol/L (ref 20–29)
Calcium: 9.5 mg/dL (ref 8.7–10.2)
Chloride: 102 mmol/L (ref 96–106)
Creatinine, Ser: 1.13 mg/dL (ref 0.76–1.27)
Globulin, Total: 2.3 g/dL (ref 1.5–4.5)
Glucose: 89 mg/dL (ref 70–99)
Potassium: 4.4 mmol/L (ref 3.5–5.2)
Sodium: 141 mmol/L (ref 134–144)
Total Protein: 6.8 g/dL (ref 6.0–8.5)
eGFR: 75 mL/min/1.73 (ref >59)

## 2024-03-16 NOTE — Progress Notes (Signed)
Results through My Chart

## 2024-04-07 ENCOUNTER — Other Ambulatory Visit: Payer: Self-pay | Admitting: Medical

## 2025-03-28 ENCOUNTER — Encounter: Payer: Self-pay | Admitting: Medical
